# Patient Record
Sex: Female | Born: 1950 | Hispanic: No | Marital: Married | Smoking: Never smoker
Health system: Southern US, Community
[De-identification: ages and names within clinical notes are randomized; demographics above are authoritative.]

## PROBLEM LIST (undated history)

## (undated) ENCOUNTER — Ambulatory Visit (HOSPITAL_COMMUNITY): Admission: EM | Discharge status: Absent without leave | Payer: Self-pay

## (undated) DIAGNOSIS — M8580 Other specified disorders of bone density and structure, unspecified site: Secondary | ICD-10-CM

## (undated) HISTORY — PX: INCONTINENCE SURGERY: SHX676

---

## 2001-12-24 ENCOUNTER — Other Ambulatory Visit: Admission: RE | Admit: 2001-12-24 | Discharge: 2001-12-24 | Payer: Self-pay | Admitting: Family Medicine

## 2002-01-07 ENCOUNTER — Encounter: Admission: RE | Admit: 2002-01-07 | Discharge: 2002-01-07 | Payer: Self-pay | Admitting: Family Medicine

## 2002-01-07 ENCOUNTER — Encounter: Payer: Self-pay | Admitting: Family Medicine

## 2004-09-26 ENCOUNTER — Ambulatory Visit: Payer: Self-pay | Admitting: Family Medicine

## 2004-10-30 ENCOUNTER — Other Ambulatory Visit: Admission: RE | Admit: 2004-10-30 | Discharge: 2004-10-30 | Payer: Self-pay | Admitting: Family Medicine

## 2004-10-30 ENCOUNTER — Ambulatory Visit: Payer: Self-pay | Admitting: Family Medicine

## 2005-12-25 ENCOUNTER — Encounter: Payer: Self-pay | Admitting: Family Medicine

## 2005-12-25 ENCOUNTER — Other Ambulatory Visit: Admission: RE | Admit: 2005-12-25 | Discharge: 2005-12-25 | Payer: Self-pay | Admitting: Family Medicine

## 2005-12-25 ENCOUNTER — Ambulatory Visit: Payer: Self-pay | Admitting: Family Medicine

## 2006-04-22 ENCOUNTER — Ambulatory Visit: Payer: Self-pay | Admitting: Family Medicine

## 2007-05-20 ENCOUNTER — Ambulatory Visit: Payer: Self-pay | Admitting: Family Medicine

## 2010-07-21 ENCOUNTER — Encounter: Payer: Self-pay | Admitting: Family Medicine

## 2013-03-04 ENCOUNTER — Emergency Department (INDEPENDENT_AMBULATORY_CARE_PROVIDER_SITE_OTHER)
Admission: EM | Admit: 2013-03-04 | Discharge: 2013-03-04 | Disposition: A | Discharge status: Home / Self Care | Payer: 59 | Source: Home / Self Care | Attending: Family Medicine | Admitting: Family Medicine

## 2013-03-04 ENCOUNTER — Encounter: Payer: Self-pay | Admitting: *Deleted

## 2013-03-04 DIAGNOSIS — M533 Sacrococcygeal disorders, not elsewhere classified: Secondary | ICD-10-CM

## 2013-03-04 HISTORY — DX: Other specified disorders of bone density and structure, unspecified site: M85.80

## 2013-03-04 MED ORDER — MELOXICAM 15 MG PO TABS: 15.0000 mg | ORAL_TABLET | Freq: Every day | ORAL | Status: DC

## 2013-03-04 NOTE — ED Notes (Signed)
Pt c/o lower back pain started on 8/28, resolved then started again last night. Pain does not radiate. Hx of osteopenia on right lower back. Taken Advil and IBF OTC.

## 2013-03-04 NOTE — ED Provider Notes (Signed)
CSN: [id number]     Arrival date & time 03/04/13  1311 History   First MD Initiated Contact with Patient 03/04/13 1341     Chief Complaint  Patient presents with  . Back Pain      HPI Comments: While getting out of shower 9 days ago, patient developed right low back pain which gradually improved.  Yesterday after lifting a heavy box at work, she developed stiffness in her lower back.  Today she has pain in her right lower back that does not radiate.  No bowel or bladder dysfunction.  No saddle numbness.  Patient is a 62 y.o. female presenting with back pain. The history is provided by the patient.  Back Pain Location:  Sacro-iliac joint Quality:  Aching Radiates to:  Does not radiate Pain severity:  Mild Pain is:  Worse during the day Onset quality:  Gradual Duration:  1 day Timing:  Constant Progression:  Unchanged Chronicity:  Recurrent Context: lifting heavy objects   Relieved by:  Nothing Worsened by:  Bending and twisting Ineffective treatments:  Ibuprofen Associated symptoms: no abdominal pain, no bladder incontinence, no bowel incontinence, no dysuria, no fever, no leg pain, no numbness, no paresthesias, no pelvic pain, no perianal numbness, no tingling, no weakness and no weight loss   Risk factors comment:  History of osteopenia   Past Medical History  Diagnosis Date  . Osteopenia    Past Surgical History  Procedure Laterality Date  . Incontinence surgery     History reviewed. No pertinent family history. History  Substance Use Topics  . Smoking status: Never Smoker   . Smokeless tobacco: Never Used  . Alcohol Use: No   OB History   Grav Para Term Preterm Abortions TAB SAB Ect Mult Living                 Review of Systems  Constitutional: Negative for fever and weight loss.  Gastrointestinal: Negative for abdominal pain and bowel incontinence.  Genitourinary: Negative for bladder incontinence, dysuria and pelvic pain.  Musculoskeletal: Positive for back  pain.  Neurological: Negative for tingling, weakness, numbness and paresthesias.  All other systems reviewed and are negative.    Allergies  Review of patient's allergies indicates no known allergies.  Home Medications   Current Outpatient Rx  Name  Route  Sig  Dispense  Refill  . meloxicam (MOBIC) 15 MG tablet   Oral   Take 1 tablet (15 mg total) by mouth daily. Take with food each morning   15 tablet   0    BP 135/82  Pulse 68  Resp 16  Ht 5\' 4"  (1.626 m)  Wt 144 lb (65.318 kg)  BMI 24.71 kg/m2  SpO2 100% Physical Exam Nursing notes and Vital Signs reviewed. Appearance:  Patient appears healthy, stated age, and in no acute distress Eyes:  Pupils are equal, round, and reactive to light and accomodation.  Extraocular movement is intact.  Conjunctivae are not inflamed  Pharynx:  Normal Neck:  Supple.   No adenopathy Lungs:  Clear to auscultation.  Breath sounds are equal.  Heart:  Regular rate and rhythm without murmurs, rubs, or gallops.  Abdomen:  Nontender without masses or hepatosplenomegaly.  Bowel sounds are present.  No CVA or   Flank tenderness Extremities:  No edema.  No calf tenderness Skin:  No rash present.   Back:  Decreased range of motion.  Can heel/toe walk and squat without difficulty.  Back is nontender to palpation.  Straight  leg raising test is negative.  Sitting knee extension test is negative.  Strength and sensation in the lower extremities is normal.  Patellar and achilles reflexes are normal  ED Course  Procedures  none        MDM   1. Sacroiliac joint dysfunction of both sides    Begin Mobic. Apply ice pack 3 to 4 times daily.  Use lumbar pad while driving.  Begin stretching exercises. Followup with [facility] if not improving about two weeks.     Lattie Haw, MD 03/07/13 2010

## 2013-03-09 ENCOUNTER — Encounter: Payer: Self-pay | Admitting: Sports Medicine

## 2013-03-09 ENCOUNTER — Ambulatory Visit (INDEPENDENT_AMBULATORY_CARE_PROVIDER_SITE_OTHER): Payer: 59 | Admitting: Sports Medicine

## 2013-03-09 ENCOUNTER — Ambulatory Visit: Payer: 59 | Admitting: Physical Therapy

## 2013-03-09 VITALS — BP 133/77 | HR 63 | Wt 145.0 lb

## 2013-03-09 DIAGNOSIS — M545 Low back pain, unspecified: Secondary | ICD-10-CM

## 2013-03-09 DIAGNOSIS — M5416 Radiculopathy, lumbar region: Secondary | ICD-10-CM | POA: Insufficient documentation

## 2013-03-09 DIAGNOSIS — M6281 Muscle weakness (generalized): Secondary | ICD-10-CM

## 2013-03-09 MED ORDER — PREDNISONE 50 MG PO TABS: ORAL_TABLET | ORAL | Status: DC

## 2013-03-09 NOTE — Progress Notes (Signed)
   Subjective:    I'm seeing this patient as a consultation for:  Dr. Cathren Harsh  CC: Low back pain  HPI: This is a very pleasant 62 year old female, unfortunately several weeks ago she flexed her right leg and leaned forward in the shower to clean her leg. She felt a pop in her back and subsequent pain that radiated down the posterolateral aspect of her right leg and buttock, but not past the knee. Since then the pain has been worse when sitting in a car, flexion, and with Valsalva. No bowel or bladder dysfunction, symptoms are moderate, persistent. She was given some Mobic, and this helped significantly. Unfortunately she still has pain and difficulty with moving. No bowel or bladder dysfunction, no saddle numbness.  Past medical history, Surgical history, Family history not pertinant except as noted below, Social history, Allergies, and medications have been entered into the medical record, reviewed, and no changes needed.   Review of Systems: No headache, visual changes, nausea, vomiting, diarrhea, constipation, dizziness, abdominal pain, skin rash, fevers, chills, night sweats, weight loss, swollen lymph nodes, body aches, joint swelling, muscle aches, chest pain, shortness of breath, mood changes, visual or auditory hallucinations.   Objective:   General: Well Developed, well nourished, and in no acute distress.  Neuro/Psych: Alert and oriented x3, extra-ocular muscles intact, able to move all 4 extremities, sensation grossly intact. Skin: Warm and dry, no rashes noted.  Respiratory: Not using accessory muscles, speaking in full sentences, trachea midline.  Cardiovascular: Pulses palpable, no extremity edema. Abdomen: Does not appear distended. Back Exam:  Inspection: Unremarkable  Motion: Flexion 45 deg, Extension 45 deg, Side Bending to 45 deg bilaterally,  Rotation to 45 deg bilaterally  SLR laying: Negative  XSLR laying: Negative  Palpable tenderness: None. FABER: negative. Sensory  change: Gross sensation intact to all lumbar and sacral dermatomes.  Reflexes: 2+ at both patellar tendons, 2+ at achilles tendons, Babinski's downgoing.  Strength at foot  Plantar-flexion: 5/5 Dorsi-flexion: 5/5 Eversion: 5/5 Inversion: 5/5  Leg strength  Quad: 5/5 Hamstring: 5/5 Hip flexor: 5/5 Hip abductors: 5/5  Gait unremarkable. Impression and Recommendations:   This case required medical decision making of moderate complexity.

## 2013-03-09 NOTE — Assessment & Plan Note (Signed)
Axial predominantly discogenic. Prednisone for 5 days, out of work until Monday, continue Mobic. Return to see me in 4 weeks, then we can consider an MRI for interventional injection planning if no better after that.

## 2013-03-10 ENCOUNTER — Encounter: Payer: 59 | Admitting: Physical Therapy

## 2013-03-10 DIAGNOSIS — M545 Low back pain: Secondary | ICD-10-CM

## 2013-03-10 DIAGNOSIS — M6281 Muscle weakness (generalized): Secondary | ICD-10-CM

## 2013-03-14 ENCOUNTER — Encounter: Payer: 59 | Admitting: Physical Therapy

## 2013-03-14 DIAGNOSIS — M545 Low back pain: Secondary | ICD-10-CM

## 2013-03-14 DIAGNOSIS — M6281 Muscle weakness (generalized): Secondary | ICD-10-CM

## 2013-03-15 ENCOUNTER — Encounter: Payer: 59 | Admitting: Physical Therapy

## 2013-03-21 ENCOUNTER — Encounter: Payer: 59 | Admitting: Physical Therapy

## 2013-03-21 DIAGNOSIS — M6281 Muscle weakness (generalized): Secondary | ICD-10-CM

## 2013-03-21 DIAGNOSIS — M545 Low back pain: Secondary | ICD-10-CM

## 2013-03-24 ENCOUNTER — Encounter: Payer: 59 | Admitting: Physical Therapy

## 2013-03-24 DIAGNOSIS — M545 Low back pain: Secondary | ICD-10-CM

## 2013-03-24 DIAGNOSIS — M6281 Muscle weakness (generalized): Secondary | ICD-10-CM

## 2013-04-07 ENCOUNTER — Ambulatory Visit: Payer: 59 | Admitting: Sports Medicine

## 2014-02-28 ENCOUNTER — Ambulatory Visit: Payer: Self-pay

## 2014-05-23 DIAGNOSIS — M722 Plantar fascial fibromatosis: Secondary | ICD-10-CM | POA: Insufficient documentation

## 2014-05-23 DIAGNOSIS — E559 Vitamin D deficiency, unspecified: Secondary | ICD-10-CM | POA: Insufficient documentation

## 2014-05-23 DIAGNOSIS — L720 Epidermal cyst: Secondary | ICD-10-CM | POA: Insufficient documentation

## 2014-05-23 DIAGNOSIS — M810 Age-related osteoporosis without current pathological fracture: Secondary | ICD-10-CM | POA: Insufficient documentation

## 2014-05-23 DIAGNOSIS — J302 Other seasonal allergic rhinitis: Secondary | ICD-10-CM | POA: Insufficient documentation

## 2014-05-23 DIAGNOSIS — G4700 Insomnia, unspecified: Secondary | ICD-10-CM | POA: Insufficient documentation

## 2014-05-23 DIAGNOSIS — Z780 Asymptomatic menopausal state: Secondary | ICD-10-CM | POA: Insufficient documentation

## 2014-05-23 DIAGNOSIS — M7661 Achilles tendinitis, right leg: Secondary | ICD-10-CM | POA: Insufficient documentation

## 2014-06-06 ENCOUNTER — Ambulatory Visit (INDEPENDENT_AMBULATORY_CARE_PROVIDER_SITE_OTHER): Payer: 59

## 2014-06-06 VITALS — BP 105/62 | HR 62 | Resp 12

## 2014-06-06 DIAGNOSIS — M79671 Pain in right foot: Secondary | ICD-10-CM

## 2014-06-06 DIAGNOSIS — M7731 Calcaneal spur, right foot: Secondary | ICD-10-CM

## 2014-06-06 DIAGNOSIS — M7661 Achilles tendinitis, right leg: Secondary | ICD-10-CM

## 2014-06-06 DIAGNOSIS — M722 Plantar fascial fibromatosis: Secondary | ICD-10-CM

## 2014-06-06 MED ORDER — DICLOFENAC SODIUM 75 MG PO TBEC: 75.0000 mg | DELAYED_RELEASE_TABLET | Freq: Two times a day (BID) | ORAL | Status: DC

## 2014-06-06 NOTE — Patient Instructions (Signed)

## 2014-06-06 NOTE — Progress Notes (Signed)
   Subjective:    Patient ID: Brooke CharsKamaljit Grabinski, female    DOB: 03/19/1951, 63 y.o.   MRN: [id number]  HPI PT STATED BACK AND BOTTOM OF THE HEEL IS BEEN PAINFUL FOR 3 WEEKS. THE HEEL IS BEEN THE SAME BUT GET WORSE WHEN DRIVING OR FLEXING. TRIED STRETCHING AND MASSAGE OIL AND IT HELP SOME.   Review of Systems  All other systems reviewed and are negative.      Objective:   Physical Exam Lower extremity objective findings as follows this is a 63 year old female well-developed well-nourished oriented 3 presents at this time with a complaint of pain probably 2 or 3 year history of pain she's had plantar fascial pain in the past she's had retrocalcaneal pain currently the posterior inferior right heel are painful has constant pain in the back of her right heel kilos tendon insertion which is reproducible at this time. Also has sharp pain in the inferior heel pain on first up in the morning are first up after a period of rest also aggravated after sitting or driving and it in her truck for a period of about 30 minutes or longer. No history of injury or trauma otherwise noted patient's worn inserts in her shoes wearing good stable shoes at this time. Objective findings as follows vascular status is intact with pedal pulses palpable DP and PT +2 over 4 bilateral Refill time 3 seconds all digits epicritic and proprioceptive sensations intact and symmetric bilateral there is normal plantar response DTRs not listed dermatologically skin color pigment normal hair growth absent nails somewhat criptotic. Orthopedic exam there is pain on palpation of the retrocalcaneal insertion site of the Achilles tendon also some inferior medial plantar fascial band insertion . Right foot more painful than left at this time. X-rays brought by the patient indicated well-developed inferior retrocalcaneal spurring posterior middle facet subtalar joint intact no fractures no cyst or tumors clinically there is presence of posterior  calcaneal area that the Achilles tendon insertion with some soft tissue swelling consistent with Achilles tendinitis [place] compensatory gait changes due to plantar fascial symptomology as well.       Assessment & Plan:  Assessment Achilles tendinitis secondary plantar fascial symptoms on June strain and inferior calcaneal spurring patient declines steroid injection or prednisone at this time will use an NSAID in the passive been on low boot was tried diclofenac 75 mg twice a day with 1 refill month 1 month supply with 1 refill is issued. Patient will also use warm compresses ice pack or ice to the posterior heel area as well as posterior inferior heel area fascial strapping applied to the right foot reassess within 2 weeks for follow-up may be candidate for orthoses or other noninvasive or invasive treatment based on progress or failure to improve and possibly combination of plantar fasciitis as well as Achilles tendinitis to compensatory gait changes patient also has a history of some back issues treated earlier this year the pain is sharp pain inferior heel area may be associated with a radiculopathy will need to address that if it fails to improve next  Alvan Dameichard Theodore Rahrig DPM

## 2014-06-27 ENCOUNTER — Ambulatory Visit: Payer: 59

## 2014-09-06 ENCOUNTER — Ambulatory Visit: Payer: 59 | Admitting: Internal Medicine

## 2014-10-26 DIAGNOSIS — R5383 Other fatigue: Secondary | ICD-10-CM | POA: Insufficient documentation

## 2015-04-12 DIAGNOSIS — M1711 Unilateral primary osteoarthritis, right knee: Secondary | ICD-10-CM | POA: Insufficient documentation

## 2015-07-25 DIAGNOSIS — B360 Pityriasis versicolor: Secondary | ICD-10-CM | POA: Insufficient documentation

## 2016-05-09 ENCOUNTER — Telehealth: Payer: Self-pay | Admitting: Rheumatology

## 2016-05-09 NOTE — Telephone Encounter (Signed)
Patient is having pain in shoulder after patient got her flu shot on 04/18/16. Patient states the pain is worse at night. Please advise.

## 2016-05-09 NOTE — Telephone Encounter (Signed)
She should call the provider who administered the vaccine, or see her primary care. She voiced understanding, states since she got the vaccine at the pharmacy she will contact her PCP

## 2016-05-27 ENCOUNTER — Ambulatory Visit (INDEPENDENT_AMBULATORY_CARE_PROVIDER_SITE_OTHER): Payer: 59

## 2016-05-27 ENCOUNTER — Encounter: Payer: Self-pay | Admitting: Sports Medicine

## 2016-05-27 ENCOUNTER — Ambulatory Visit (INDEPENDENT_AMBULATORY_CARE_PROVIDER_SITE_OTHER): Payer: 59 | Admitting: Sports Medicine

## 2016-05-27 DIAGNOSIS — M7542 Impingement syndrome of left shoulder: Secondary | ICD-10-CM

## 2016-05-27 DIAGNOSIS — M25512 Pain in left shoulder: Secondary | ICD-10-CM

## 2016-05-27 NOTE — Assessment & Plan Note (Signed)
Symptoms present for 5 weeks now and waking her from sleep. Subacromial injection as above, rehabilitation exercises given, x-rays, return to see me in one month.

## 2016-05-27 NOTE — Progress Notes (Signed)
   Subjective:    I'm seeing this patient as a consultation for:  Dr. Riley NearingAguiar  CC: Left shoulder pain  HPI: Over one month ago this pleasant 65 year old female had a flu shot, she feels as though it was placed to high, subsequently she's had pain that she localizes over the deltoid, worse with overhead activities, moderate, persistent without radiation, it does wake her at night. No constitutional symptoms, no mechanical symptoms  Past medical history:  Negative.  See flowsheet/record as well for more information.  Surgical history: Negative.  See flowsheet/record as well for more information.  Family history: Negative.  See flowsheet/record as well for more information.  Social history: Negative.  See flowsheet/record as well for more information.  Allergies, and medications have been entered into the medical record, reviewed, and no changes needed.   Review of Systems: No headache, visual changes, nausea, vomiting, diarrhea, constipation, dizziness, abdominal pain, skin rash, fevers, chills, night sweats, weight loss, swollen lymph nodes, body aches, joint swelling, muscle aches, chest pain, shortness of breath, mood changes, visual or auditory hallucinations.   Objective:   General: Well Developed, well nourished, and in no acute distress.  Neuro/Psych: Alert and oriented x3, extra-ocular muscles intact, able to move all 4 extremities, sensation grossly intact. Skin: Warm and dry, no rashes noted.  Respiratory: Not using accessory muscles, speaking in full sentences, trachea midline.  Cardiovascular: Pulses palpable, no extremity edema. Abdomen: Does not appear distended. Left Shoulder: Inspection reveals no abnormalities, atrophy or asymmetry. Palpation is normal with no tenderness over AC joint or bicipital groove. ROM is full in all planes. Rotator cuff strength normal throughout. Positive Neer and Hawkin's tests, empty can. Speeds and Yergason's tests normal. No labral pathology  noted with negative Obrien's, negative crank, negative clunk, and good stability. Normal scapular function observed. No painful arc and no drop arm sign. No apprehension sign  Procedure: Real-time Ultrasound Guided Injection of left subacromial bursa Device: GE Logiq E  Verbal informed consent obtained.  Time-out conducted.  Noted no overlying erythema, induration, or other signs of local infection.  Skin prepped in a sterile fashion.  Local anesthesia: Topical Ethyl chloride.  With sterile technique and under real time ultrasound guidance:  1 mL kenalog 40, 1 mL lidocaine, 1 mL Marcaine injected easily. Completed without difficulty  Pain immediately resolved suggesting accurate placement of the medication.  Advised to call if fevers/chills, erythema, induration, drainage, or persistent bleeding.  Images permanently stored and available for review in the ultrasound unit.  Impression: Technically successful ultrasound guided injection.  Impression and Recommendations:   This case required medical decision making of moderate complexity.  Impingement syndrome of shoulder, left Symptoms present for 5 weeks now and waking her from sleep. Subacromial injection as above, rehabilitation exercises given, x-rays, return to see me in one month.

## 2016-06-26 ENCOUNTER — Ambulatory Visit (INDEPENDENT_AMBULATORY_CARE_PROVIDER_SITE_OTHER): Payer: 59 | Admitting: Sports Medicine

## 2016-06-26 DIAGNOSIS — M170 Bilateral primary osteoarthritis of knee: Secondary | ICD-10-CM | POA: Insufficient documentation

## 2016-06-26 DIAGNOSIS — M7542 Impingement syndrome of left shoulder: Secondary | ICD-10-CM

## 2016-06-26 DIAGNOSIS — M1711 Unilateral primary osteoarthritis, right knee: Secondary | ICD-10-CM

## 2016-06-26 MED ORDER — DICLOFENAC SODIUM 2 % TD SOLN: 2.0000 | Freq: Two times a day (BID) | TRANSDERMAL | 11 refills | Status: DC

## 2016-06-26 NOTE — Assessment & Plan Note (Signed)
Resolved after subacromial injection.

## 2016-06-26 NOTE — Assessment & Plan Note (Addendum)
Patient has been seeing chiropractor who added a lift to her left orthotic, that he made. Unfortunately the left leg is actually longer than the right, I would like her to come back and have me build her some custom orthotics with a lift on the right. Adding topical diclofenac, formal physical therapy. She already has an MRI, the results of which are in the overview, she has also just finished a course of Synvisc 4 months ago. Return to see me for custom orthotics.

## 2016-06-26 NOTE — Progress Notes (Signed)
   Subjective:    I'm seeing this patient as a consultation for:  Dr. Bernadette Hoitafaela Aguiar  CC: Right knee pain  HPI: Shoulder pain: Resolved after subacromial injection at the last visit.  Knee pain: Present for a long time, known osteoarthritis, she also has an MRI that shows osteoarthritis with degenerative meniscal findings, she finished a Synvisc series in August of this year. Having persistent pain, would like to avoid oral NSAIDs for now. Her main goal here is to gain her range of motion and strength. She does have a set of custom orthotics made by her chiropractor, who told her that her right leg was longer and placed a lift on the left.  Past medical history:  Negative.  See flowsheet/record as well for more information.  Surgical history: Negative.  See flowsheet/record as well for more information.  Family history: Negative.  See flowsheet/record as well for more information.  Social history: Negative.  See flowsheet/record as well for more information.  Allergies, and medications have been entered into the medical record, reviewed, and no changes needed.   Review of Systems: No headache, visual changes, nausea, vomiting, diarrhea, constipation, dizziness, abdominal pain, skin rash, fevers, chills, night sweats, weight loss, swollen lymph nodes, body aches, joint swelling, muscle aches, chest pain, shortness of breath, mood changes, visual or auditory hallucinations.   Objective:   General: Well Developed, well nourished, and in no acute distress.  Neuro/Psych: Alert and oriented x3, extra-ocular muscles intact, able to move all 4 extremities, sensation grossly intact. Skin: Warm and dry, no rashes noted.  Respiratory: Not using accessory muscles, speaking in full sentences, trachea midline.  Cardiovascular: Pulses palpable, no extremity edema. Abdomen: Does not appear distended. Right Knee: Normal to inspection with no erythema or effusion or obvious bony abnormalities. Tenderness  at the medial joint line. ROM normal in flexion and extension and lower leg rotation. Ligaments with solid consistent endpoints including ACL, PCL, LCL, MCL. Negative Mcmurray's and provocative meniscal tests. Non painful patellar compression. Patellar and quadriceps tendons unremarkable. Hamstring and quadriceps strength is normal.  Left leg is longer than the right by 2 cm, this was measured with measuring tape.  Impression and Recommendations:   This case required medical decision making of moderate complexity.  Impingement syndrome of shoulder, left Resolved after subacromial injection.  Primary osteoarthritis of right knee Patient has been seeing chiropractor who added a lift to her left orthotic, that he made. Unfortunately the left leg is actually longer than the right, I would like her to come back and have me build her some custom orthotics with a lift on the right. Adding topical diclofenac, formal physical therapy. She already has an MRI, the results of which are in the overview, she has also just finished a course of Synvisc 4 months ago. Return to see me for custom orthotics.

## 2016-07-02 ENCOUNTER — Ambulatory Visit (INDEPENDENT_AMBULATORY_CARE_PROVIDER_SITE_OTHER): Payer: 59 | Admitting: Sports Medicine

## 2016-07-02 DIAGNOSIS — M1711 Unilateral primary osteoarthritis, right knee: Secondary | ICD-10-CM

## 2016-07-02 NOTE — Assessment & Plan Note (Signed)
Custom orthotics as above with lift on the right. Continue Pennsaid, rehabilitation exercises given. Return to see me in one month.

## 2016-07-02 NOTE — Progress Notes (Addendum)
    Patient was fitted for a : standard, cushioned, semi-rigid orthotic. The orthotic was heated and afterward the patient stood on the orthotic blank positioned on the orthotic stand. The patient was positioned in subtalar neutral position and 10 degrees of ankle dorsiflexion in a weight bearing stance. After completion of molding, a stable base was applied to the orthotic blank. The blank was ground to a stable position for weight bearing. Size: 7 Base: [facility] Additional Posting and Padding: Extra EVA on the right The patient ambulated these, and they were very comfortable.  I spent 40 minutes with this patient, greater than 50% was face-to-face time counseling regarding the below diagnosis.

## 2016-07-29 ENCOUNTER — Encounter: Payer: 59 | Admitting: Sports Medicine

## 2016-08-05 ENCOUNTER — Encounter: Payer: 59 | Admitting: Sports Medicine

## 2016-08-14 ENCOUNTER — Encounter: Payer: 59 | Admitting: Sports Medicine

## 2017-03-11 ENCOUNTER — Ambulatory Visit (INDEPENDENT_AMBULATORY_CARE_PROVIDER_SITE_OTHER): Payer: 59 | Admitting: Sports Medicine

## 2017-03-11 DIAGNOSIS — M7542 Impingement syndrome of left shoulder: Secondary | ICD-10-CM

## 2017-03-11 MED ORDER — IBUPROFEN 800 MG PO TABS: 800.0000 mg | ORAL_TABLET | Freq: Three times a day (TID) | ORAL | 2 refills | Status: DC | PRN

## 2017-03-11 NOTE — Progress Notes (Signed)
  Subjective:    CC: Recurrence of left shoulder pain  HPI: This is a pleasant 66 year old female, she had left rotator cuff dysfunction at the last visit, I injected her about 10 months ago she did extremely well. Now having a recurrence of pain over her left deltoid, worse with lifting and overhead activities, moderate, persistent without radiation.  Past medical history:  Negative.  See flowsheet/record as well for more information.  Surgical history: Negative.  See flowsheet/record as well for more information.  Family history: Negative.  See flowsheet/record as well for more information.  Social history: Negative.  See flowsheet/record as well for more information.  Allergies, and medications have been entered into the medical record, reviewed, and no changes needed.   Review of Systems: No fevers, chills, night sweats, weight loss, chest pain, or shortness of breath.   Objective:    General: Well Developed, well nourished, and in no acute distress.  Neuro: Alert and oriented x3, extra-ocular muscles intact, sensation grossly intact.  HEENT: Normocephalic, atraumatic, pupils equal round reactive to light, neck supple, no masses, no lymphadenopathy, thyroid nonpalpable.  Skin: Warm and dry, no rashes. Cardiac: Regular rate and rhythm, no murmurs rubs or gallops, no lower extremity edema.  Respiratory: Clear to auscultation bilaterally. Not using accessory muscles, speaking in full sentences. Left Shoulder: Inspection reveals no abnormalities, atrophy or asymmetry. Palpation is normal with no tenderness over AC joint or bicipital groove. ROM is full in all planes. Rotator cuff strength normal throughout. Positive Neer and Hawkin's tests, empty can. Speeds and Yergason's tests normal. No labral pathology noted with negative Obrien's, negative crank, negative clunk, and good stability. Normal scapular function observed. Positive painful arc and no drop arm sign. No apprehension  sign  Impression and Recommendations:    Impingement syndrome of shoulder, left Recurrence of left shoulder impingement symptoms. Responded extremely well to a subacromial injection 10 months ago. No injection today, I'm going to get her on ibuprofen 800 as well as rehabilitation exercises with theraband. Return in one month, injection if no better.  I spent 25 minutes with this patient, greater than 50% was face-to-face time counseling regarding the above diagnoses, I spent an extensive amount of time showing her how to do the rehabilitation exercises. ___________________________________________ Ihor Austinhomas J. Benjamin Stainhekkekandam, M.D., ABFM., CAQSM. Primary Care and Sports Medicine [facility]  Adjunct Instructor of Family Medicine  [employer]

## 2017-03-11 NOTE — Assessment & Plan Note (Signed)
Recurrence of left shoulder impingement symptoms. Responded extremely well to a subacromial injection 10 months ago. No injection today, I'm going to get her on ibuprofen 800 as well as rehabilitation exercises with theraband. Return in one month, injection if no better.

## 2017-03-24 ENCOUNTER — Encounter: Payer: Self-pay | Admitting: Sports Medicine

## 2017-03-24 ENCOUNTER — Ambulatory Visit (INDEPENDENT_AMBULATORY_CARE_PROVIDER_SITE_OTHER): Payer: 59 | Admitting: Sports Medicine

## 2017-03-24 DIAGNOSIS — M7542 Impingement syndrome of left shoulder: Secondary | ICD-10-CM | POA: Diagnosis not present

## 2017-03-24 NOTE — Assessment & Plan Note (Signed)
Patient returned early, she is improving considerably, she did respond well to 11 months ago to a subacromial injection. Continuing to improve with ibuprofen 800 mg, rehabilitation exercises. No further intervention today and she can return as needed.

## 2017-03-24 NOTE — Progress Notes (Signed)
  Subjective:    CC: follow-up  HPI: This is a pleasant 66 year old female, we added rotator cuff exercises the last visit, she returns a bit early, with pain on most completely resolved. She does desire that we write her a specific work restrictions noted.  Past medical history:  Negative.  See flowsheet/record as well for more information.  Surgical history: Negative.  See flowsheet/record as well for more information.  Family history: Negative.  See flowsheet/record as well for more information.  Social history: Negative.  See flowsheet/record as well for more information.  Allergies, and medications have been entered into the medical record, reviewed, and no changes needed.   Review of Systems: No fevers, chills, night sweats, weight loss, chest pain, or shortness of breath.   Objective:    General: Well Developed, well nourished, and in no acute distress.  Neuro: Alert and oriented x3, extra-ocular muscles intact, sensation grossly intact.  HEENT: Normocephalic, atraumatic, pupils equal round reactive to light, neck supple, no masses, no lymphadenopathy, thyroid nonpalpable.  Skin: Warm and dry, no rashes. Cardiac: Regular rate and rhythm, no murmurs rubs or gallops, no lower extremity edema.  Respiratory: Clear to auscultation bilaterally. Not using accessory muscles, speaking in full sentences. Left Shoulder: Inspection reveals no abnormalities, atrophy or asymmetry. Palpation is normal with no tenderness over AC joint or bicipital groove. ROM is full in all planes. Rotator cuff strength normal throughout. No signs of impingement with negative Neer and Hawkin's tests, empty can. Speeds and Yergason's tests normal. No labral pathology noted with negative Obrien's, negative crank, negative clunk, and good stability. Normal scapular function observed. No painful arc and no drop arm sign. No apprehension sign  Impression and Recommendations:    Impingement syndrome of  shoulder, left Patient returned early, she is improving considerably, she did respond well to 11 months ago to a subacromial injection. Continuing to improve with ibuprofen 800 mg, rehabilitation exercises. No further intervention today and she can return as needed.  ___________________________________________ Ihor Austin. Benjamin Stain, M.D., ABFM., CAQSM. Primary Care and Sports Medicine [facility]  Adjunct Instructor of Family Medicine  [employer]

## 2017-04-08 ENCOUNTER — Ambulatory Visit: Payer: 59 | Admitting: Sports Medicine

## 2017-06-05 ENCOUNTER — Ambulatory Visit (INDEPENDENT_AMBULATORY_CARE_PROVIDER_SITE_OTHER): Payer: 59 | Admitting: Sports Medicine

## 2017-06-05 ENCOUNTER — Encounter: Payer: Self-pay | Admitting: Sports Medicine

## 2017-06-05 DIAGNOSIS — M170 Bilateral primary osteoarthritis of knee: Secondary | ICD-10-CM

## 2017-06-05 NOTE — Assessment & Plan Note (Signed)
Right knee is doing well, left knee is having significant pain, not better with physical therapy, ibuprofen. Patient tells me that her insurance covers Visco supplementation without approval. She does have x-rays from an outside clinic. Reaction knee brace. Steroid injection and Monovisc injection today, return in 1 month.

## 2017-06-05 NOTE — Progress Notes (Signed)
  Subjective:    CC: Left knee pain  HPI: This is a pleasant 66 year old female, she works for [employer], for years she has had right knee pain, this seemed to resolve with custom orthotics with a heel lift.  Unfortunately she has developed pain in her left knee, medial joint line, under the kneecap with gelling, no mechanical symptoms, moderate, persistent without radiation, taking ibuprofen with only minimal relief.  She has done the knee rehab exercises also with only minimal relief.  She has gotten Visco [facility] the [facility], tells me that her insurance company covers Visco supplementation without prior approval.  Past medical history:  Negative.  See flowsheet/record as well for more information.  Surgical history: Negative.  See flowsheet/record as well for more information.  Family history: Negative.  See flowsheet/record as well for more information.  Social history: Negative.  See flowsheet/record as well for more information.  Allergies, and medications have been entered into the medical record, reviewed, and no changes needed.   (To billers/coders, pertinent past medical, social, surgical, family history can be found in problem list, if problem list is marked as reviewed then this indicates that past medical, social, surgical, family history was also reviewed)  Review of Systems: No fevers, chills, night sweats, weight loss, chest pain, or shortness of breath.   Objective:    General: Well Developed, well nourished, and in no acute distress.  Neuro: Alert and oriented x3, extra-ocular muscles intact, sensation grossly intact.  HEENT: Normocephalic, atraumatic, pupils equal round reactive to light, neck supple, no masses, no lymphadenopathy, thyroid nonpalpable.  Skin: Warm and dry, no rashes. Cardiac: Regular rate and rhythm, no murmurs rubs or gallops, no lower extremity edema.  Respiratory: Clear to auscultation bilaterally. Not using accessory  muscles, speaking in full sentences. Left knee: Only minimal effusion with tenderness at the medial joint line ROM normal in flexion and extension and lower leg rotation. Ligaments with solid consistent endpoints including ACL, PCL, LCL, MCL. Negative Mcmurray's and provocative meniscal tests. Non painful patellar compression. Patellar and quadriceps tendons unremarkable. Hamstring and quadriceps strength is normal.  Procedure: Real-time Ultrasound Guided Injection of left knee Device: GE Logiq E  Verbal informed consent obtained.  Time-out conducted.  Noted no overlying erythema, induration, or other signs of local infection.  Skin prepped in a sterile fashion.  Local anesthesia: Topical Ethyl chloride.  With sterile technique and under real time ultrasound guidance: Using a 22-gauge needle advanced into the suprapatellar recess, there was a mild effusion, I then injected 1 cc kenalog 40, 2 cc lidocaine, 2 cc bupivacaine, syringe switched and Monovisc injected in its entirety into the knee as well. Completed without difficulty  Pain immediately resolved suggesting accurate placement of the medication.  Advised to call if fevers/chills, erythema, induration, drainage, or persistent bleeding.  Images permanently stored and available for review in the ultrasound unit.  Impression: Technically successful ultrasound guided injection.  Impression and Recommendations:    Primary osteoarthritis of both knees Right knee is doing well, left knee is having significant pain, not better with physical therapy, ibuprofen. Patient tells me that her insurance covers Visco supplementation without approval. She does have x-rays from an outside clinic. Reaction knee brace. Steroid injection and Monovisc injection today, return in 1 month.  ___________________________________________ Ihor Austinhomas J. Benjamin Stainhekkekandam, M.D., ABFM., CAQSM. Primary Care and Sports Medicine [facility]  [facility]  Adjunct Instructor of Family Medicine  [employer]

## 2017-06-15 ENCOUNTER — Encounter: Payer: Self-pay | Admitting: Sports Medicine

## 2017-06-15 ENCOUNTER — Ambulatory Visit (INDEPENDENT_AMBULATORY_CARE_PROVIDER_SITE_OTHER): Payer: 59 | Admitting: Sports Medicine

## 2017-06-15 DIAGNOSIS — M170 Bilateral primary osteoarthritis of knee: Secondary | ICD-10-CM | POA: Diagnosis not present

## 2017-06-15 DIAGNOSIS — M5416 Radiculopathy, lumbar region: Secondary | ICD-10-CM

## 2017-06-15 MED ORDER — PREDNISONE 50 MG PO TABS: ORAL_TABLET | ORAL | 0 refills | Status: DC

## 2017-06-15 MED ORDER — TRAMADOL HCL 50 MG PO TABS: ORAL_TABLET | ORAL | 0 refills | Status: DC

## 2017-06-15 NOTE — Progress Notes (Signed)
  Subjective:    CC: Back and leg pain  HPI: Knee osteoarthritis: We did a modest injection about a week ago, overall doing well.  Unfortunately she still notes some pain in her knee but she describes in the posterior aspect now, on further questioning it radiates from her back down the back of the left leg to the outside of the left foot. He does have moderate back pain, moderate, persistent. No bowel or bladder trunk, saddle numbness, constitutional symptoms, or trauma.  Past medical history:  Negative.  See flowsheet/record as well for more information.  Surgical history: Negative.  See flowsheet/record as well for more information.  Family history: Negative.  See flowsheet/record as well for more information.  Social history: Negative.  See flowsheet/record as well for more information.  Allergies, and medications have been entered into the medical record, reviewed, and no changes needed.   (To billers/coders, pertinent past medical, social, surgical, family history can be found in problem list, if problem list is marked as reviewed then this indicates that past medical, social, surgical, family history was also reviewed)  Review of Systems: No fevers, chills, night sweats, weight loss, chest pain, or shortness of breath.   Objective:    General: Well Developed, well nourished, and in no acute distress.  Neuro: Alert and oriented x3, extra-ocular muscles intact, sensation grossly intact.  HEENT: Normocephalic, atraumatic, pupils equal round reactive to light, neck supple, no masses, no lymphadenopathy, thyroid nonpalpable.  Skin: Warm and dry, no rashes. Cardiac: Regular rate and rhythm, no murmurs rubs or gallops, no lower extremity edema.  Respiratory: Clear to auscultation bilaterally. Not using accessory muscles, speaking in full sentences. Left Knee: Normal to inspection with no erythema or effusion or obvious bony abnormalities. Palpation normal with no warmth or joint line  tenderness or patellar tenderness or condyle tenderness. ROM normal in flexion and extension and lower leg rotation. Ligaments with solid consistent endpoints including ACL, PCL, LCL, MCL. Negative Mcmurray's and provocative meniscal tests. Non painful patellar compression. Patellar and quadriceps tendons unremarkable. Hamstring and quadriceps strength is normal.  Impression and Recommendations:    Primary osteoarthritis of both knees Seems to be doing okay. We did steroid and Monovisc injection about a week ago.   Left lumbar radiculitis Left L5 radiculitis. 5 days of prednisone, tramadol, she is leaving on her trip today. She can return and we can add physical therapy if needed. ___________________________________________ Ihor Austinhomas J. Benjamin Stainhekkekandam, M.D., ABFM., CAQSM. Primary Care and Sports Medicine [facility]  Adjunct Instructor of Family Medicine  [employer]

## 2017-06-15 NOTE — Assessment & Plan Note (Signed)
Seems to be doing okay. We did steroid and Monovisc injection about a week ago.

## 2017-06-15 NOTE — Assessment & Plan Note (Signed)
Left L5 radiculitis. 5 days of prednisone, tramadol, she is leaving on her trip today. She can return and we can add physical therapy if needed.

## 2017-07-09 ENCOUNTER — Encounter: Payer: Self-pay | Admitting: Sports Medicine

## 2017-07-09 ENCOUNTER — Ambulatory Visit (INDEPENDENT_AMBULATORY_CARE_PROVIDER_SITE_OTHER): Payer: 59 | Admitting: Sports Medicine

## 2017-07-09 DIAGNOSIS — M25562 Pain in left knee: Secondary | ICD-10-CM

## 2017-07-09 DIAGNOSIS — G8929 Other chronic pain: Secondary | ICD-10-CM

## 2017-07-09 DIAGNOSIS — M170 Bilateral primary osteoarthritis of knee: Secondary | ICD-10-CM | POA: Diagnosis not present

## 2017-07-09 NOTE — Assessment & Plan Note (Signed)
Persistent pain in the left knee, minimal swelling, posterior medial joint line. She really does not have any mechanical symptoms. We have done several steroid injections as well as Monovisc, therapy, bracing without much improvement. She does have pain with terminal flexion, all of this is likely representing a degenerative meniscal tear, MRI and referral to Dr. Ramond Marrowax Varkey for arthroscopy. She will continue 800 mg ibuprofen for now.

## 2017-07-09 NOTE — Progress Notes (Signed)
  Subjective:    CC: Recheck knee pain  HPI: This is a pleasant 67 year old female, she works at [employer], we have been treating her left knee pain for some time now, she had steroid injections, Visco supplementation, she has mild osteoarthritis on x-rays, we have done oral ibuprofen 800, therapy, bracing, unfortunately she is continued to have pain, mostly at the medial joint line, moderate, persistent without radiation.  I reviewed the past medical history, family history, social history, surgical history, and allergies today and no changes were needed.  Please see the problem list section below in epic for further details.  Past Medical History: Past Medical History:  Diagnosis Date  . Osteopenia    Past Surgical History: Past Surgical History:  Procedure Laterality Date  . INCONTINENCE SURGERY     Social History: Social History   Socioeconomic History  . Marital status: Married    Spouse name: None  . Number of children: None  . Years of education: None  . Highest education level: None  Social Needs  . Financial resource strain: None  . Food insecurity - worry: None  . Food insecurity - inability: None  . Transportation needs - medical: None  . Transportation needs - non-medical: None  Occupational History  . None  Tobacco Use  . Smoking status: Never Smoker  . Smokeless tobacco: Never Used  Substance and Sexual Activity  . Alcohol use: No  . Drug use: No  . Sexual activity: None  Other Topics Concern  . None  Social History Narrative  . None   Family History: No family history on file. Allergies: No Known Allergies Medications: See med rec.  Review of Systems: No fevers, chills, night sweats, weight loss, chest pain, or shortness of breath.   Objective:    General: Well Developed, well nourished, and in no acute distress.  Neuro: Alert and oriented x3, extra-ocular muscles intact, sensation grossly intact.  HEENT: Normocephalic, atraumatic, pupils equal  round reactive to light, neck supple, no masses, no lymphadenopathy, thyroid nonpalpable.  Skin: Warm and dry, no rashes. Cardiac: Regular rate and rhythm, no murmurs rubs or gallops, no lower extremity edema.  Respiratory: Clear to auscultation bilaterally. Not using accessory muscles, speaking in full sentences. Left Knee: Normal to inspection with no erythema or effusion or obvious bony abnormalities. Tender to palpation at the posterior/medial joint line. ROM normal in flexion and extension and lower leg rotation.  Reproduction of pain with terminal flexion. Ligaments with solid consistent endpoints including ACL, PCL, LCL, MCL. McMurray's produces pain but no pop. Non painful patellar compression. Patellar and quadriceps tendons unremarkable. Hamstring and quadriceps strength is normal.  Impression and Recommendations:    Primary osteoarthritis of both knees Persistent pain in the left knee, minimal swelling, posterior medial joint line. She really does not have any mechanical symptoms. We have done several steroid injections as well as Monovisc, therapy, bracing without much improvement. She does have pain with terminal flexion, all of this is likely representing a degenerative meniscal tear, MRI and referral to Dr. Ramond Marrowax Varkey for arthroscopy. She will continue 800 mg ibuprofen for now.  ___________________________________________ Ihor Austinhomas J. Benjamin Stainhekkekandam, M.D., ABFM., CAQSM. Primary Care and Sports Medicine [facility]  Adjunct Instructor of Family Medicine  [employer]

## 2017-07-13 ENCOUNTER — Ambulatory Visit (INDEPENDENT_AMBULATORY_CARE_PROVIDER_SITE_OTHER): Payer: 59

## 2017-07-13 DIAGNOSIS — M7122 Synovial cyst of popliteal space [Baker], left knee: Secondary | ICD-10-CM

## 2017-07-13 DIAGNOSIS — M23222 Derangement of posterior horn of medial meniscus due to old tear or injury, left knee: Secondary | ICD-10-CM

## 2017-07-13 DIAGNOSIS — M94262 Chondromalacia, left knee: Secondary | ICD-10-CM | POA: Diagnosis not present

## 2017-07-13 DIAGNOSIS — G8929 Other chronic pain: Secondary | ICD-10-CM

## 2017-07-13 DIAGNOSIS — M25562 Pain in left knee: Secondary | ICD-10-CM

## 2017-07-13 DIAGNOSIS — M170 Bilateral primary osteoarthritis of knee: Secondary | ICD-10-CM

## 2017-10-09 ENCOUNTER — Ambulatory Visit (INDEPENDENT_AMBULATORY_CARE_PROVIDER_SITE_OTHER): Payer: 59

## 2017-10-09 ENCOUNTER — Ambulatory Visit (INDEPENDENT_AMBULATORY_CARE_PROVIDER_SITE_OTHER): Payer: 59 | Admitting: Sports Medicine

## 2017-10-09 ENCOUNTER — Encounter: Payer: Self-pay | Admitting: Sports Medicine

## 2017-10-09 DIAGNOSIS — M5416 Radiculopathy, lumbar region: Secondary | ICD-10-CM | POA: Diagnosis not present

## 2017-10-09 DIAGNOSIS — M5136 Other intervertebral disc degeneration, lumbar region: Secondary | ICD-10-CM | POA: Diagnosis not present

## 2017-10-09 DIAGNOSIS — M50322 Other cervical disc degeneration at C5-C6 level: Secondary | ICD-10-CM

## 2017-10-09 DIAGNOSIS — M5030 Other cervical disc degeneration, unspecified cervical region: Secondary | ICD-10-CM | POA: Diagnosis not present

## 2017-10-09 MED ORDER — IBUPROFEN 400 MG PO TABS: 400.0000 mg | ORAL_TABLET | Freq: Three times a day (TID) | ORAL | 3 refills | Status: DC | PRN

## 2017-10-09 NOTE — Progress Notes (Signed)
Subjective:    I'm seeing this patient as a consultation for: Dr. Bernadette Hoit  CC: Neck and back pain  HPI: Low back pain: Left-sided, worse with sitting, flexion, Valsalva, significant stiffness in the morning, no bowel or bladder dysfunction, saddle numbness, constitutional symptoms, does not radiate past the knee.  Neck pain: Axial, occasional radiation into the left periscapular region.  No radiation to the hand or fingertips.  I reviewed the past medical history, family history, social history, surgical history, and allergies today and no changes were needed.  Please see the problem list section below in epic for further details.  Past Medical History: Past Medical History:  Diagnosis Date  . Osteopenia    Past Surgical History: Past Surgical History:  Procedure Laterality Date  . INCONTINENCE SURGERY     Social History: Social History   Socioeconomic History  . Marital status: Married    Spouse name: Not on file  . Number of children: Not on file  . Years of education: Not on file  . Highest education level: Not on file  Occupational History  . Not on file  Social Needs  . Financial resource strain: Not on file  . Food insecurity:    Worry: Not on file    Inability: Not on file  . Transportation needs:    Medical: Not on file    Non-medical: Not on file  Tobacco Use  . Smoking status: Never Smoker  . Smokeless tobacco: Never Used  Substance and Sexual Activity  . Alcohol use: No  . Drug use: No  . Sexual activity: Not on file  Lifestyle  . Physical activity:    Days per week: Not on file    Minutes per session: Not on file  . Stress: Not on file  Relationships  . Social connections:    Talks on phone: Not on file    Gets together: Not on file    Attends religious service: Not on file    Active member of club or organization: Not on file    Attends meetings of clubs or organizations: Not on file    Relationship status: Not on file  Other  Topics Concern  . Not on file  Social History Narrative  . Not on file   Family History: No family history on file. Allergies: No Known Allergies Medications: See med rec.  Review of Systems: No headache, visual changes, nausea, vomiting, diarrhea, constipation, dizziness, abdominal pain, skin rash, fevers, chills, night sweats, weight loss, swollen lymph nodes, body aches, joint swelling, muscle aches, chest pain, shortness of breath, mood changes, visual or auditory hallucinations.   Objective:   General: Well Developed, well nourished, and in no acute distress.  Neuro:  Extra-ocular muscles intact, able to move all 4 extremities, sensation grossly intact.  Deep tendon reflexes tested were normal. Psych: Alert and oriented, mood congruent with affect. ENT:  Ears and nose appear unremarkable.  Hearing grossly normal. Neck: Unremarkable overall appearance, trachea midline.  No visible thyroid enlargement. Eyes: Conjunctivae and lids appear unremarkable.  Pupils equal and round. Skin: Warm and dry, no rashes noted.  Cardiovascular: Pulses palpable, no extremity edema. Neck: Negative spurling's Full neck range of motion Grip strength and sensation normal in bilateral hands Strength good C4 to T1 distribution No sensory change to C4 to T1 Reflexes normal Back Exam:  Inspection: Unremarkable  Motion: Flexion 45 deg, Extension 45 deg, Side Bending to 45 deg bilaterally,  Rotation to 45 deg bilaterally  SLR  laying: Negative  XSLR laying: Negative  Palpable tenderness: Minimal tenderness over the paralumbar muscles. FABER: negative. Sensory change: Gross sensation intact to all lumbar and sacral dermatomes.  Reflexes: 2+ at both patellar tendons, 2+ at achilles tendons, Babinski's downgoing.  Strength at foot  Plantar-flexion: 5/5 Dorsi-flexion: 5/5 Eversion: 5/5 Inversion: 5/5  Leg strength  Quad: 5/5 Hamstring: 5/5 Hip flexor: 5/5 Hip abductors: 5/5  Gait unremarkable. Left  Hip: ROM IR: 60 Deg, ER: 60 Deg, Flexion: 120 Deg, Extension: 100 Deg, Abduction: 45 Deg, Adduction: 45 Deg Strength IR: 5/5, ER: 5/5, Flexion: 5/5, Extension: 5/5, Abduction: 5/5, Adduction: 5/5 Pelvic alignment unremarkable to inspection and palpation. Standing hip rotation and gait without trendelenburg / unsteadiness. Greater trochanter without tenderness to palpation. No tenderness over piriformis. No SI joint tenderness and normal minimal SI movement.  Impression and Recommendations:   This case required medical decision making of moderate complexity.  Left lumbar radiculitis We have treated her in the past for left L5 radiculitis, getting new x-rays, ibuprofen 400 mg at night and again in the morning, formal physical therapy, she does need weekend and after hours care. Return to see me in 1 month, MR for interventional planning if no better.  DDD (degenerative disc disease), cervical X-rays, physical therapy, ibuprofen. Return to see me in 1 month. Mostly left-sided periscapular radicular symptoms. ___________________________________________ Ihor Austinhomas J. Benjamin Stainhekkekandam, M.D., ABFM., CAQSM. Primary Care and Sports Medicine [facility]  Adjunct Instructor of Family Medicine  [employer]

## 2017-10-09 NOTE — Assessment & Plan Note (Signed)
X-rays, physical therapy, ibuprofen. Return to see me in 1 month. Mostly left-sided periscapular radicular symptoms.

## 2017-10-09 NOTE — Assessment & Plan Note (Signed)
We have treated her in the past for left L5 radiculitis, getting new x-rays, ibuprofen 400 mg at night and again in the morning, formal physical therapy, she does need weekend and after hours care. Return to see me in 1 month, MR for interventional planning if no better.

## 2017-10-13 ENCOUNTER — Telehealth: Payer: Self-pay | Admitting: Sports Medicine

## 2017-10-13 NOTE — Telephone Encounter (Signed)
Routing for review. I see where one has been resulted, will make sure he is still OK with Pt starting PT.

## 2017-10-13 NOTE — Telephone Encounter (Signed)
Pt advised of results and recommendation. Verbalized understanding.

## 2017-10-13 NOTE — Telephone Encounter (Signed)
Pt. Called this morning asking that you call her. She had x-rays on Friday. She would like to know what Dr. Melvia Heaps's opinion was before she goes to physical therapy.

## 2017-10-13 NOTE — Telephone Encounter (Signed)
I is, preesh.  :-P

## 2017-11-10 ENCOUNTER — Ambulatory Visit: Payer: 59 | Admitting: Sports Medicine

## 2018-01-21 ENCOUNTER — Encounter: Payer: Self-pay | Admitting: Sports Medicine

## 2018-01-21 ENCOUNTER — Ambulatory Visit (INDEPENDENT_AMBULATORY_CARE_PROVIDER_SITE_OTHER): Payer: 59 | Admitting: Sports Medicine

## 2018-01-21 DIAGNOSIS — M170 Bilateral primary osteoarthritis of knee: Secondary | ICD-10-CM | POA: Diagnosis not present

## 2018-01-21 MED ORDER — CELEBREX 200 MG PO CAPS: ORAL_CAPSULE | ORAL | 2 refills | Status: DC

## 2018-01-21 NOTE — Assessment & Plan Note (Signed)
At this point has been through steroid injections, she had minimal relief from Monovisc, she does desire to try Orthovisc again, tells me she does not need approval for these. Before we do that I would like to try Celebrex, she prefers branded medication only. If insufficient relief of her full body pains with Celebrex at the follow-up visit we will start Orthovisc series in both knees. She did see Dr. Everardo PacificVarkey who suggested more conservative measures due to the unpredictability of knee arthroscopy in the setting of underlying osteoarthritis.

## 2018-01-21 NOTE — Progress Notes (Signed)
Subjective:    CC: Bilateral knee pain  HPI: This is a pleasant 67 year old female, we have been treating her for knee osteoarthritis, she has had Monovisc, steroid injections, NSAIDs, physical therapy, none of this seemed to help long-term, an MRI showed a complex left medial meniscal tear, she was seen by Dr. Everardo PacificVarkey who opted for further conservative measures considering concrement and osteoarthritis.  At this point she would like to discuss another NSAID and proceeding again with Visco supplementation.  I reviewed the past medical history, family history, social history, surgical history, and allergies today and no changes were needed.  Please see the problem list section below in epic for further details.  Past Medical History: Past Medical History:  Diagnosis Date  . Osteopenia    Past Surgical History: Past Surgical History:  Procedure Laterality Date  . INCONTINENCE SURGERY     Social History: Social History   Socioeconomic History  . Marital status: Married    Spouse name: Not on file  . Number of children: Not on file  . Years of education: Not on file  . Highest education level: Not on file  Occupational History  . Not on file  Social Needs  . Financial resource strain: Not on file  . Food insecurity:    Worry: Not on file    Inability: Not on file  . Transportation needs:    Medical: Not on file    Non-medical: Not on file  Tobacco Use  . Smoking status: Never Smoker  . Smokeless tobacco: Never Used  Substance and Sexual Activity  . Alcohol use: No  . Drug use: No  . Sexual activity: Not on file  Lifestyle  . Physical activity:    Days per week: Not on file    Minutes per session: Not on file  . Stress: Not on file  Relationships  . Social connections:    Talks on phone: Not on file    Gets together: Not on file    Attends religious service: Not on file    Active member of club or organization: Not on file    Attends meetings of clubs or  organizations: Not on file    Relationship status: Not on file  Other Topics Concern  . Not on file  Social History Narrative  . Not on file   Family History: No family history on file. Allergies: No Known Allergies Medications: See med rec.  Review of Systems: No fevers, chills, night sweats, weight loss, chest pain, or shortness of breath.   Objective:    General: Well Developed, well nourished, and in no acute distress.  Neuro: Alert and oriented x3, extra-ocular muscles intact, sensation grossly intact.  HEENT: Normocephalic, atraumatic, pupils equal round reactive to light, neck supple, no masses, no lymphadenopathy, thyroid nonpalpable.  Skin: Warm and dry, no rashes. Cardiac: Regular rate and rhythm, no murmurs rubs or gallops, no lower extremity edema.  Respiratory: Clear to auscultation bilaterally. Not using accessory muscles, speaking in full sentences. Bilateral knees: Normal to inspection with no erythema or effusion or obvious bony abnormalities. Palpation normal with no warmth or joint line tenderness or patellar tenderness or condyle tenderness. ROM normal in flexion and extension and lower leg rotation. Ligaments with solid consistent endpoints including ACL, PCL, LCL, MCL. Negative Mcmurray's and provocative meniscal tests. Non painful patellar compression. Patellar and quadriceps tendons unremarkable. Hamstring and quadriceps strength is normal.  Impression and Recommendations:    Primary osteoarthritis of both knees At this point  has been through steroid injections, she had minimal relief from Monovisc, she does desire to try Orthovisc again, tells me she does not need approval for these. Before we do that I would like to try Celebrex, she prefers branded medication only. If insufficient relief of her full body pains with Celebrex at the follow-up visit we will start Orthovisc series in both knees. She did see Dr. Everardo Pacific who suggested more conservative  measures due to the unpredictability of knee arthroscopy in the setting of underlying osteoarthritis.  I spent 40 minutes with this patient, greater than 50% was face-to-face time counseling regarding the above diagnoses ___________________________________________ Ihor Austin. Benjamin Stain, M.D., ABFM., CAQSM. Primary Care and Sports Medicine [facility]  Adjunct Instructor of Family Medicine  [employer]

## 2018-01-25 ENCOUNTER — Telehealth: Payer: Self-pay

## 2018-01-25 DIAGNOSIS — M170 Bilateral primary osteoarthritis of knee: Secondary | ICD-10-CM

## 2018-01-25 MED ORDER — CELEBREX 200 MG PO CAPS: 200.0000 mg | ORAL_CAPSULE | Freq: Every day | ORAL | 2 refills | Status: DC | PRN

## 2018-01-25 NOTE — Telephone Encounter (Signed)
Pt was seen last week and had requested that her RX for Celebrex be sent in for name brand only.   Pt's RX was not covered by ins and she is now asking that we give [facility] the OK to fill medication for generic.   [facility]ist Dimas AguasHoward, gave new verbal RX to fill for generic

## 2018-03-10 ENCOUNTER — Ambulatory Visit (INDEPENDENT_AMBULATORY_CARE_PROVIDER_SITE_OTHER): Payer: 59

## 2018-03-10 ENCOUNTER — Ambulatory Visit (INDEPENDENT_AMBULATORY_CARE_PROVIDER_SITE_OTHER): Payer: 59 | Admitting: Sports Medicine

## 2018-03-10 ENCOUNTER — Encounter: Payer: Self-pay | Admitting: Sports Medicine

## 2018-03-10 DIAGNOSIS — M170 Bilateral primary osteoarthritis of knee: Secondary | ICD-10-CM

## 2018-03-10 DIAGNOSIS — M25552 Pain in left hip: Secondary | ICD-10-CM | POA: Diagnosis not present

## 2018-03-10 DIAGNOSIS — M7062 Trochanteric bursitis, left hip: Secondary | ICD-10-CM | POA: Diagnosis not present

## 2018-03-10 DIAGNOSIS — M7061 Trochanteric bursitis, right hip: Secondary | ICD-10-CM

## 2018-03-10 DIAGNOSIS — M25551 Pain in right hip: Secondary | ICD-10-CM | POA: Diagnosis not present

## 2018-03-10 MED ORDER — CELECOXIB 100 MG PO CAPS: 100.0000 mg | ORAL_CAPSULE | Freq: Two times a day (BID) | ORAL | 3 refills | Status: DC

## 2018-03-10 NOTE — Progress Notes (Signed)
Subjective:    CC: Hip pain  HPI: Bilateral knee osteoarthritis: Pain improved with Celebrex, she in fact is only using it once every 2 to 3 days, would like to go down on the dose.  She has unfortunately started to have pain in both hips, lateral aspect, moderate, persistent without radiation.  Worse with sitting for long periods of time and is exacerbated when she gets up.  I reviewed the past medical history, family history, social history, surgical history, and allergies today and no changes were needed.  Please see the problem list section below in epic for further details.  Past Medical History: Past Medical History:  Diagnosis Date  . Osteopenia    Past Surgical History: Past Surgical History:  Procedure Laterality Date  . INCONTINENCE SURGERY     Social History: Social History   Socioeconomic History  . Marital status: Married    Spouse name: Not on file  . Number of children: Not on file  . Years of education: Not on file  . Highest education level: Not on file  Occupational History  . Not on file  Social Needs  . Financial resource strain: Not on file  . Food insecurity:    Worry: Not on file    Inability: Not on file  . Transportation needs:    Medical: Not on file    Non-medical: Not on file  Tobacco Use  . Smoking status: Never Smoker  . Smokeless tobacco: Never Used  Substance and Sexual Activity  . Alcohol use: No  . Drug use: No  . Sexual activity: Not on file  Lifestyle  . Physical activity:    Days per week: Not on file    Minutes per session: Not on file  . Stress: Not on file  Relationships  . Social connections:    Talks on phone: Not on file    Gets together: Not on file    Attends religious service: Not on file    Active member of club or organization: Not on file    Attends meetings of clubs or organizations: Not on file    Relationship status: Not on file  Other Topics Concern  . Not on file  Social History Narrative  . Not on  file   Family History: No family history on file. Allergies: No Known Allergies Medications: See med rec.  Review of Systems: No fevers, chills, night sweats, weight loss, chest pain, or shortness of breath.   Objective:    General: Well Developed, well nourished, and in no acute distress.  Neuro: Alert and oriented x3, extra-ocular muscles intact, sensation grossly intact.  HEENT: Normocephalic, atraumatic, pupils equal round reactive to light, neck supple, no masses, no lymphadenopathy, thyroid nonpalpable.  Skin: Warm and dry, no rashes. Cardiac: Regular rate and rhythm, no murmurs rubs or gallops, no lower extremity edema.  Respiratory: Clear to auscultation bilaterally. Not using accessory muscles, speaking in full sentences. Bilateral hips: ROM IR: 60 Deg, ER: 60 Deg, Flexion: 120 Deg, Extension: 100 Deg, Abduction: 45 Deg, Adduction: 45 Deg Strength IR: 5/5, ER: 5/5, Flexion: 5/5, Extension: 5/5, Abduction: 5/5, Adduction: 5/5 Pelvic alignment unremarkable to inspection and palpation. Standing hip rotation and gait without trendelenburg / unsteadiness. Greater trochanters with tenderness to palpation. No tenderness over piriformis. No SI joint tenderness and normal minimal SI movement.  Impression and Recommendations:    Trochanteric bursitis of both hips Pain is minimal. Bilateral x-rays. Adding rehab exercises, she does desire to drop down to 100  mg on her Celebrex.  Primary osteoarthritis of both knees Knees are doing okay, no need to proceed with further Visco supplementation. ___________________________________________ Ihor Austin. Benjamin Stain, M.D., ABFM., CAQSM. Primary Care and Sports Medicine [facility]  Adjunct Instructor of Family Medicine  [employer]

## 2018-03-10 NOTE — Assessment & Plan Note (Signed)
Pain is minimal. Bilateral x-rays. Adding rehab exercises, she does desire to drop down to 100 mg on her Celebrex.

## 2018-03-10 NOTE — Assessment & Plan Note (Signed)
Knees are doing okay, no need to proceed with further Visco supplementation.

## 2018-07-16 IMAGING — MR MR KNEE*L* W/O CM
6 series · 40 of 40 positions shown · non-contrast
Comparison: None.

CLINICAL DATA: Left knee pain and tenderness.

EXAM:
MRI OF THE LEFT KNEE WITHOUT CONTRAST
TECHNIQUE: Multiplanar, multisequence MR imaging of the knee was performed. No
intravenous contrast was administered.

[Series 3: PD fat-sat · axial · 3.0mm · 0.33mm/px · z∈[-62,+51]mm · 7 of 35 slices shown (1 of 4)]
[im 1/35]
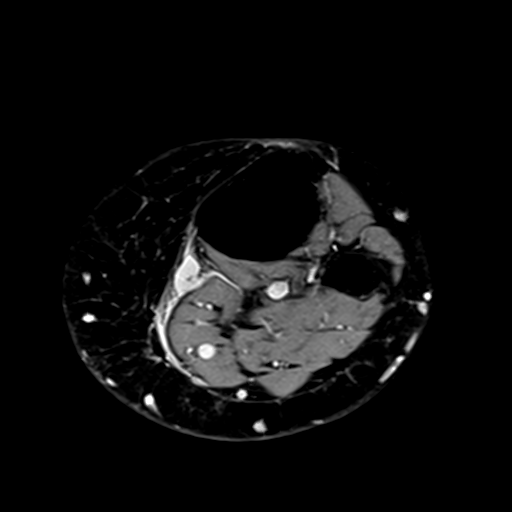
[im 6/35]
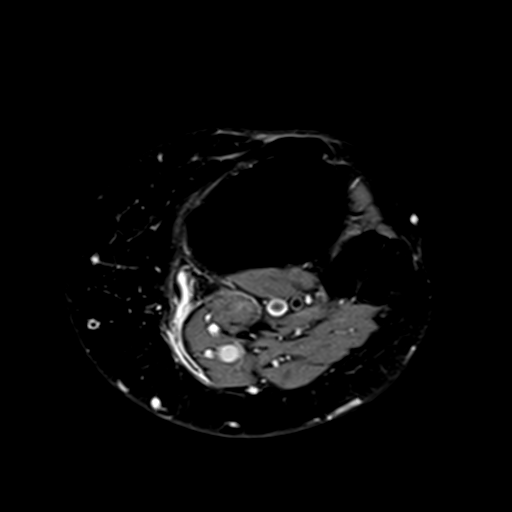
[im 12/35]
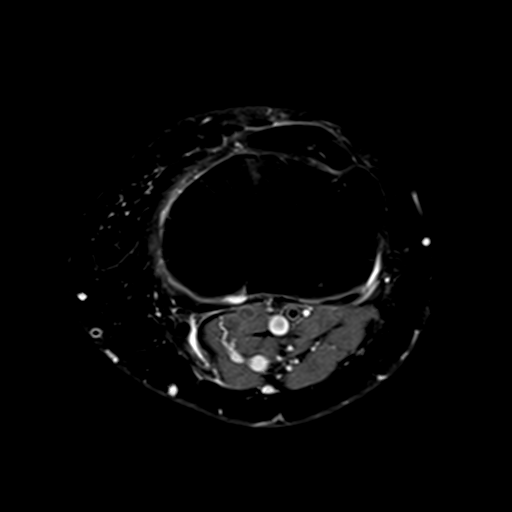
[im 18/35]
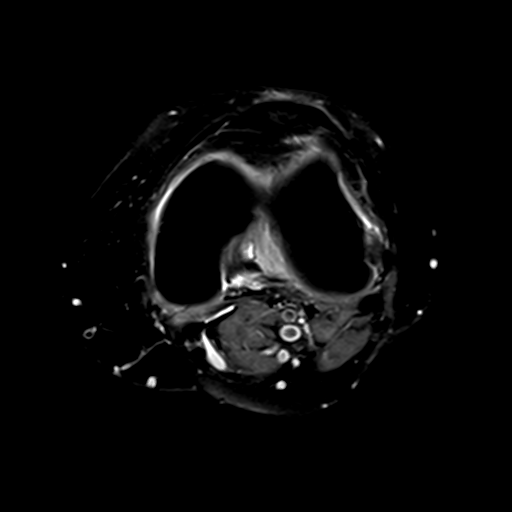
[im 23/35]
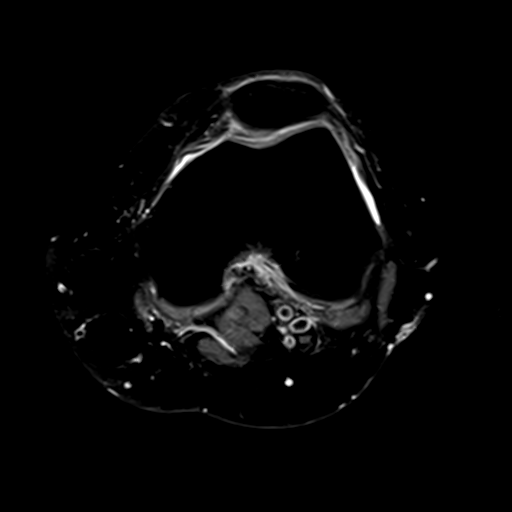
[im 29/35]
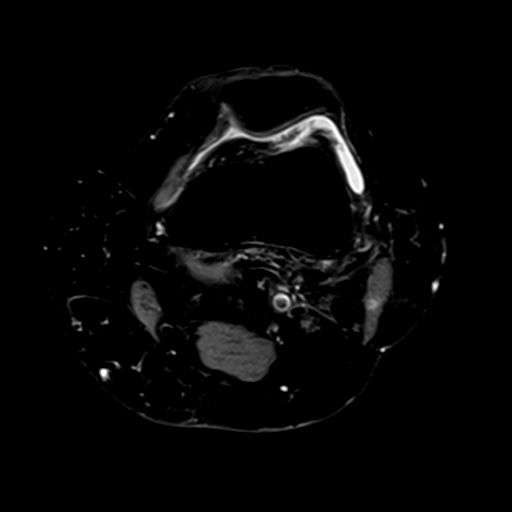
[im 35/35]
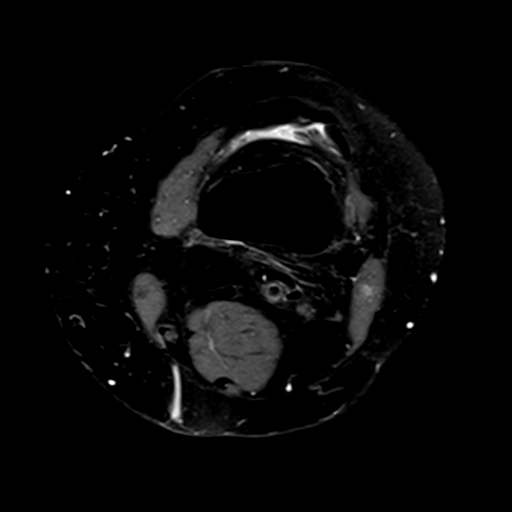

[Series 4: T1 · coronal · 3.0mm · 0.50mm/px · 7 of 31 slices shown]
[im 1/31]
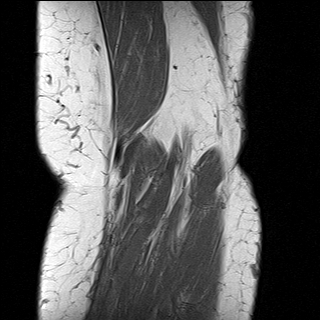
[im 6/31]
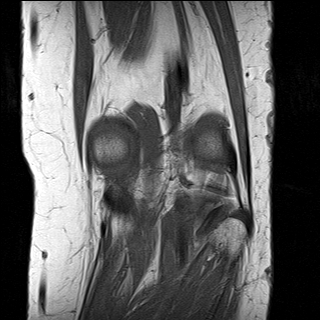
[im 11/31]
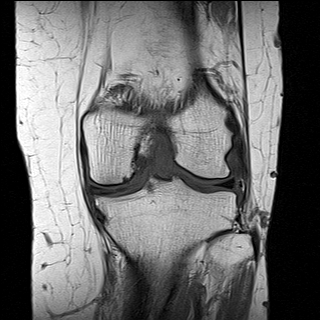
[im 16/31]
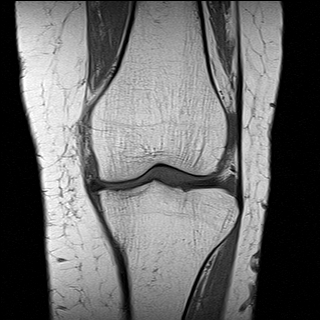
[im 21/31]
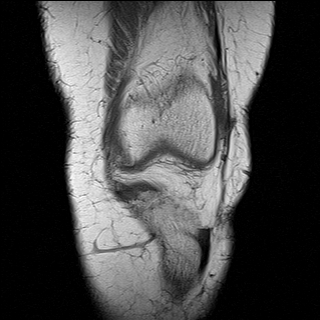
[im 26/31]
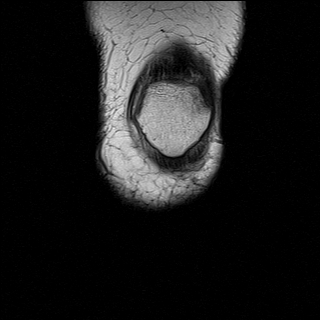
[im 31/31]
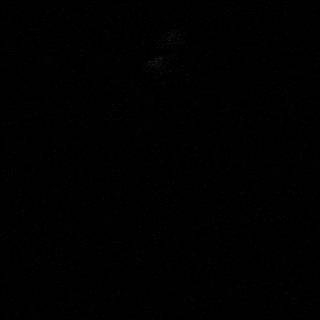

[Series 5: T2 fat-sat · coronal · 3.0mm · 0.50mm/px · 7 of 31 slices shown]
[im 1/31]
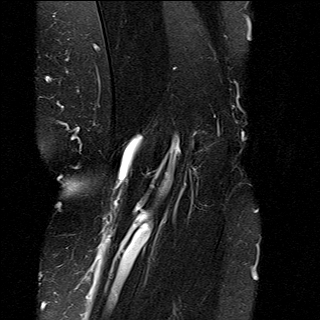
[im 6/31]
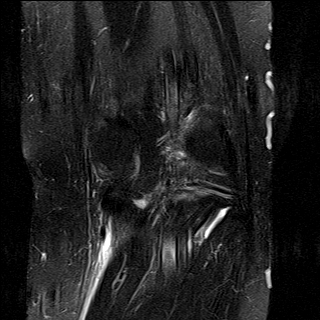
[im 11/31]
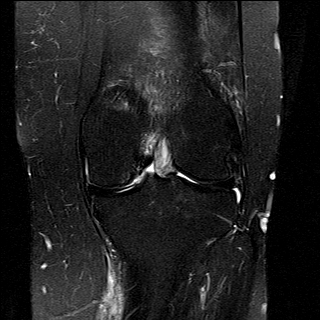
[im 16/31]
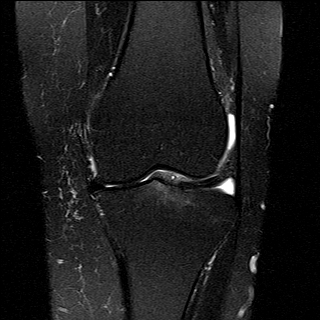
[im 21/31]
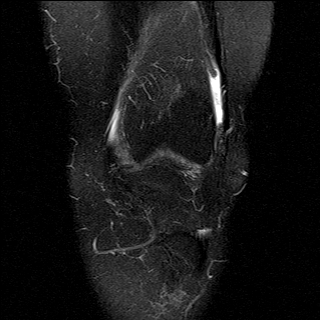
[im 26/31]
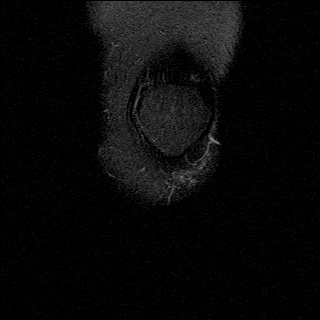
[im 31/31]
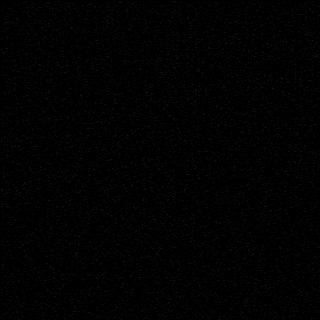

[Series 6: PD fat-sat · coronal · 3.0mm · 0.62mm/px · 7 of 31 slices shown (2 of 4)]
[im 1/31]
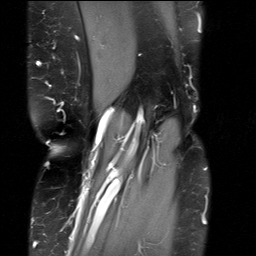
[im 6/31]
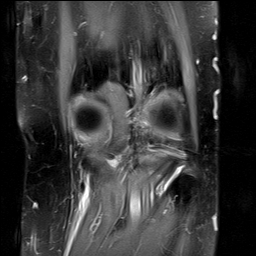
[im 11/31]
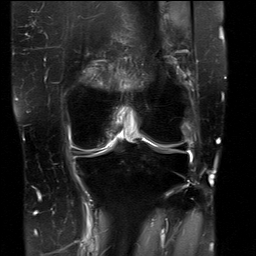
[im 16/31]
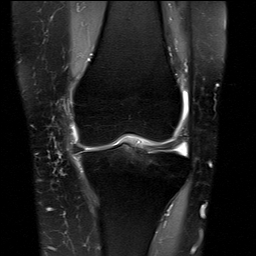
[im 21/31]
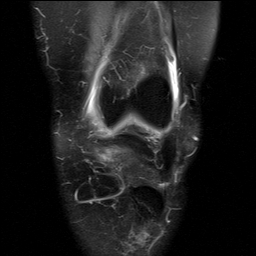
[im 26/31]
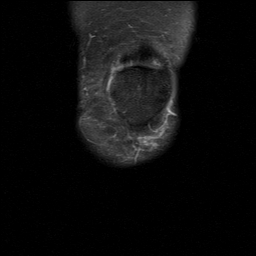
[im 31/31]
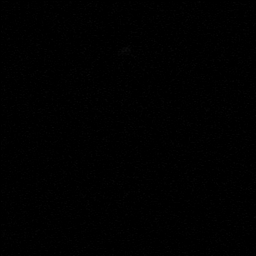

[Series 7: PD fat-sat · sagittal · 3.0mm · 0.62mm/px · 8 of 34 slices shown (3 of 4)]
[im 1/34]
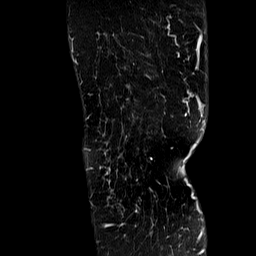
[im 5/34]
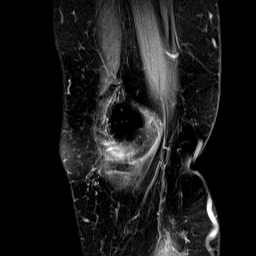
[im 10/34]
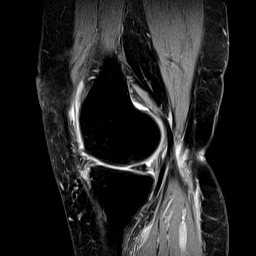
[im 15/34]
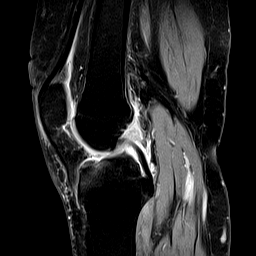
[im 19/34]
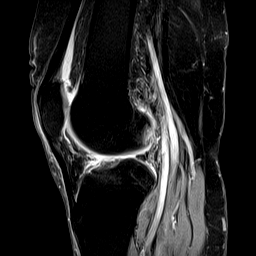
[im 24/34]
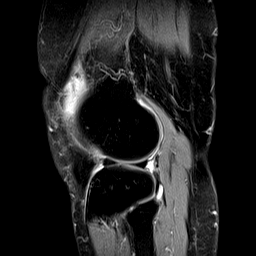
[im 29/34]
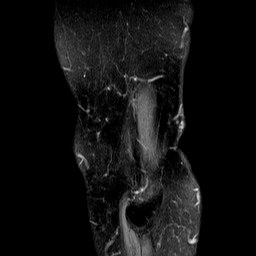
[im 34/34]
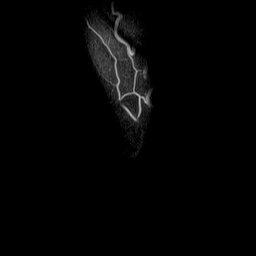

[Series 8: PD fat-sat · coronal · 2.0mm · 0.62mm/px · 4 of 19 slices shown (4 of 4)]
[im 1/19]
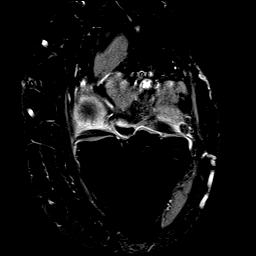
[im 7/19]
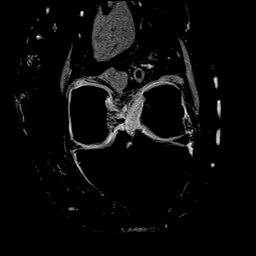
[im 13/19]
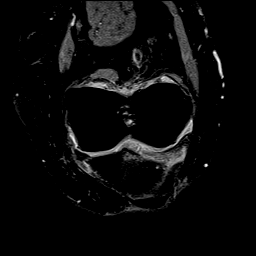
[im 19/19]
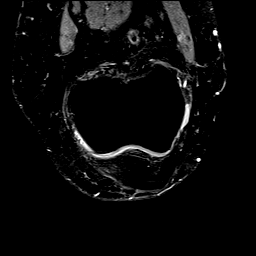

[40 of 40 positions shown; findings below may reference images not displayed]

FINDINGS: MENISCI

Medial meniscus: There is a subtle horizontal tear of the superior
surface of the posterior horn. On image 10 of series 7 there are
small superior surface and undersurface tears of the posterior horn.

Lateral meniscus:  Normal.

LIGAMENTS

Cruciates: Extensive mucoid degeneration of the intact ACL. PCL is
normal.

Collaterals:  Normal.

CARTILAGE

Patellofemoral:  Tiny focal fissure in the apex of the patella.

Medial: Small irregular areas of full-thickness cartilage loss of
the posterior central aspect of the femoral condyle.

Lateral:  Normal.

Joint:  Tiny joint effusion.  Hoffa's fat pad is normal.  No plica.

Popliteal Fossa: Small ruptured Baker's cyst with fluid extending
inferiorly superficial to the medial head of the gastrocnemius.
Intact popliteus tendon.

Extensor Mechanism:  Normal.

Bones: 8 mm small chondroid lesion in the lateral aspect of the
proximal tibial metaphysis, not felt to be significant. Slight edema
in the proximal tibia at ACL insertion consistent within
enthesopathy.

Other: None
IMPRESSION: 1. Chronic mucoid degeneration of the ACL with enthesopathy at the
tibial insertion.
2. Subtle complex tear of the posterior horn of the medial meniscus.
3. Focal minimal chondromalacia of the medial femoral condyle.
4. Small ruptured Baker's cyst.

## 2019-07-01 LAB — COLOGUARD: Cologuard: NEGATIVE

## 2019-07-07 DIAGNOSIS — H04211 Epiphora due to excess lacrimation, right lacrimal gland: Secondary | ICD-10-CM | POA: Insufficient documentation

## 2019-07-07 DIAGNOSIS — H2513 Age-related nuclear cataract, bilateral: Secondary | ICD-10-CM | POA: Insufficient documentation

## 2019-07-07 DIAGNOSIS — H25013 Cortical age-related cataract, bilateral: Secondary | ICD-10-CM | POA: Insufficient documentation

## 2019-07-07 DIAGNOSIS — H01009 Unspecified blepharitis unspecified eye, unspecified eyelid: Secondary | ICD-10-CM | POA: Insufficient documentation

## 2019-07-07 DIAGNOSIS — H02412 Mechanical ptosis of left eyelid: Secondary | ICD-10-CM | POA: Insufficient documentation

## 2020-01-16 DIAGNOSIS — H9312 Tinnitus, left ear: Secondary | ICD-10-CM | POA: Insufficient documentation

## 2020-01-16 DIAGNOSIS — H903 Sensorineural hearing loss, bilateral: Secondary | ICD-10-CM | POA: Insufficient documentation

## 2020-02-29 ENCOUNTER — Ambulatory Visit: Payer: 59 | Admitting: Sports Medicine

## 2020-11-22 ENCOUNTER — Ambulatory Visit (INDEPENDENT_AMBULATORY_CARE_PROVIDER_SITE_OTHER): Payer: 59 | Admitting: Sports Medicine

## 2020-11-22 ENCOUNTER — Other Ambulatory Visit: Payer: Self-pay

## 2020-11-22 DIAGNOSIS — M6788 Other specified disorders of synovium and tendon, other site: Secondary | ICD-10-CM | POA: Diagnosis not present

## 2020-11-22 MED ORDER — NITROGLYCERIN 0.2 MG/HR TD PT24: MEDICATED_PATCH | TRANSDERMAL | 11 refills | Status: DC

## 2020-11-22 NOTE — Progress Notes (Signed)
    Procedures performed today:    None.  Independent interpretation of notes and tests performed by another provider:   None.  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 She has tried some stretches at home but is not getting any improvement. On exam she has a bit of a Haglund's deformity, tenderness at the Achilles insertion. Negative Thompson's test. We will treat this as typical, topical nitroglycerin patches, heel lifts for all of her shoes, eccentric rehabilitation exercises, return to see me in 6 weeks, she does understand this can take 2 to 3 months to get better.     ___________________________________________ Ihor Austin. Benjamin Stain, M.D., ABFM., CAQSM. Primary Care and Sports Medicine [facility]  Adjunct Instructor of Family Medicine  [employer]

## 2020-11-22 NOTE — Assessment & Plan Note (Signed)
This is a pleasant 70 year old female, she has had several weeks of pain in the back of her left heel at the Achilles insertion. She has tried some stretches at home but is not getting any improvement. On exam she has a bit of a Haglund's deformity, tenderness at the Achilles insertion. Negative Thompson's test. We will treat this as typical, topical nitroglycerin patches, heel lifts for all of her shoes, eccentric rehabilitation exercises, return to see me in 6 weeks, she does understand this can take 2 to 3 months to get better.

## 2020-11-22 NOTE — Patient Instructions (Signed)
Begin with easy walking, heel, toe and backwards  Do calf raises on a step:  First lower and then raise on 1 foot  If this is painful, lower on 1 foot, do the heel raise on both feet Begin with 3 sets of 10 repetitions  Increase by 5 repetitions every 3 days  Goal is 3 sets of 30 repetitions  Do with both straight knee and knee at 20 degrees of flexion  If pain persists, once you can do 3 sets of 30 without weight, add backpack with 5 lbs.  Increase by 5 lbs per week to max of 30 lbs for 3 sets of 15   

## 2021-01-01 ENCOUNTER — Telehealth: Payer: Self-pay

## 2021-01-01 ENCOUNTER — Ambulatory Visit (INDEPENDENT_AMBULATORY_CARE_PROVIDER_SITE_OTHER): Payer: 59 | Admitting: Sports Medicine

## 2021-01-01 ENCOUNTER — Ambulatory Visit (INDEPENDENT_AMBULATORY_CARE_PROVIDER_SITE_OTHER): Payer: 59

## 2021-01-01 ENCOUNTER — Other Ambulatory Visit: Payer: Self-pay

## 2021-01-01 DIAGNOSIS — M7541 Impingement syndrome of right shoulder: Secondary | ICD-10-CM

## 2021-01-01 DIAGNOSIS — M6788 Other specified disorders of synovium and tendon, other site: Secondary | ICD-10-CM | POA: Diagnosis not present

## 2021-01-01 MED ORDER — IBUPROFEN 400 MG PO TABS: 400.0000 mg | ORAL_TABLET | Freq: Three times a day (TID) | ORAL | 11 refills | Status: DC | PRN

## 2021-01-01 NOTE — Telephone Encounter (Signed)
Patient returned call to office after her appt. She requested a prescription for Advil 400mg  be sent to pharmacy for however many days you wanted her to take it.

## 2021-01-01 NOTE — Telephone Encounter (Signed)
Done

## 2021-01-01 NOTE — Telephone Encounter (Signed)
Patient aware prescription sent in.  

## 2021-01-01 NOTE — Assessment & Plan Note (Signed)
Haglund deformity, insertional Achilles tendinosis, topical nitroglycerin patches have been effective, heel lifts, eccentric rehabilitation, she is a lot better but not perfect. She understands this can take 2 to 3 months and its only been about 6 weeks, return to see me in 6 more weeks, she does plan to go up to one half nitroglycerin patch. She had no adverse effects from the quarter patch.

## 2021-01-01 NOTE — Progress Notes (Signed)
    Procedures performed today:    None.  Independent interpretation of notes and tests performed by another provider:   None.  Brief History, Exam, Impression, and Recommendations:    Impingement syndrome of shoulder, right This is a pleasant 70 year old female, the past several months has had pain in her right shoulder over the deltoid, worse with overhead activities, occasionally keeping her up, and occasional 200 mg ibuprofen seems to take care of this. On exam she has impingement signs, adding x-rays, rotator cuff conditioning. Of note she had a similar problem on the left side that responded to an injection about 4-5 years ago.  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 She understands this can take 2 to 3 months and its only been about 6 weeks, return to see me in 6 more weeks, she does plan to go up to one half nitroglycerin patch. She had no adverse effects from the quarter patch.    ___________________________________________ Ihor Austin. Benjamin Stain, M.D., ABFM., CAQSM. Primary Care and Sports Medicine [facility] [facility]  Adjunct Instructor of Family Medicine  [employer]

## 2021-01-01 NOTE — Assessment & Plan Note (Signed)
This is a pleasant 70 year old female, the past several months has had pain in her right shoulder over the deltoid, worse with overhead activities, occasionally keeping her up, and occasional 200 mg ibuprofen seems to take care of this. On exam she has impingement signs, adding x-rays, rotator cuff conditioning. Of note she had a similar problem on the left side that responded to an injection about 4-5 years ago.

## 2021-01-07 ENCOUNTER — Ambulatory Visit: Payer: 59 | Admitting: Sports Medicine

## 2021-01-17 ENCOUNTER — Other Ambulatory Visit: Payer: Self-pay

## 2021-01-17 ENCOUNTER — Ambulatory Visit (INDEPENDENT_AMBULATORY_CARE_PROVIDER_SITE_OTHER): Payer: 59 | Admitting: Sports Medicine

## 2021-01-17 DIAGNOSIS — Z299 Encounter for prophylactic measures, unspecified: Secondary | ICD-10-CM | POA: Diagnosis not present

## 2021-01-17 NOTE — Progress Notes (Signed)
    Procedures performed today:    None.  Independent interpretation of notes and tests performed by another provider:   None.  Brief History, Exam, Impression, and Recommendations:    Encounter for paperwork Today I filled out FMLA forms for Achilles tendinosis, bilateral rotator cuff dysfunction, knee arthralgia.  I spent 15 minutes of total time managing this patient today, this includes chart review, face to face, and non-face to face time.  Specifically we discussed her disease processes and filled out paperwork.  ___________________________________________ Ihor Austin. Benjamin Stain, M.D., ABFM., CAQSM. Primary Care and Sports Medicine [place] [facility]  Adjunct Instructor of Family Medicine  [employer]

## 2021-01-17 NOTE — Assessment & Plan Note (Signed)
Today I filled out FMLA forms for Achilles tendinosis, bilateral rotator cuff dysfunction, knee arthralgia.

## 2021-01-22 ENCOUNTER — Ambulatory Visit: Payer: 59 | Admitting: Medical-Surgical

## 2021-02-05 ENCOUNTER — Ambulatory Visit (INDEPENDENT_AMBULATORY_CARE_PROVIDER_SITE_OTHER): Payer: 59 | Admitting: Sports Medicine

## 2021-02-05 DIAGNOSIS — M7541 Impingement syndrome of right shoulder: Secondary | ICD-10-CM

## 2021-02-05 DIAGNOSIS — M6788 Other specified disorders of synovium and tendon, other site: Secondary | ICD-10-CM

## 2021-02-05 NOTE — Assessment & Plan Note (Signed)
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 Continue nitroglycerin patches at one half patch, heel lifts, adding an air heel brace, return to see me in 6 weeks.  We filled out her FMLA paperwork again.

## 2021-02-05 NOTE — Assessment & Plan Note (Signed)
Continues to do okay with ibuprofen, conditioning exercises, she did have an injection on the left side in the past that responded to an injection 4 to 5 years ago, not interested in an injection today.

## 2021-02-05 NOTE — Progress Notes (Signed)
    Procedures performed today:    None.  Independent interpretation of notes and tests performed by another provider:   None.  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 Continue nitroglycerin patches at one half patch, heel lifts, adding an air heel brace, return to see me in 6 weeks.  We filled out her FMLA paperwork again.  Impingement syndrome of shoulder, right Continues to do okay with ibuprofen, conditioning exercises, she did have an injection on the left side in the past that responded to an injection 4 to 5 years ago, not interested in an injection today.  I spent 30 minutes of total time managing this patient today, this includes chart review, face to face, and non-face to face time.  Specifically we discussed her disease processes and filled out paperwork with her.   ___________________________________________ Ihor Austin. Benjamin Stain, M.D., ABFM., CAQSM. Primary Care and Sports Medicine [facility] [facility]  Adjunct Instructor of Family Medicine  [employer]

## 2021-02-12 ENCOUNTER — Ambulatory Visit: Payer: 59 | Admitting: Sports Medicine

## 2021-02-20 ENCOUNTER — Ambulatory Visit (INDEPENDENT_AMBULATORY_CARE_PROVIDER_SITE_OTHER): Payer: 59 | Admitting: Medical-Surgical

## 2021-02-20 ENCOUNTER — Encounter: Payer: Self-pay | Admitting: Medical-Surgical

## 2021-02-20 ENCOUNTER — Other Ambulatory Visit: Payer: Self-pay

## 2021-02-20 VITALS — BP 130/82 | HR 76 | Resp 20 | Ht 64.0 in | Wt 139.1 lb

## 2021-02-20 DIAGNOSIS — Z1231 Encounter for screening mammogram for malignant neoplasm of breast: Secondary | ICD-10-CM

## 2021-02-20 DIAGNOSIS — Z0000 Encounter for general adult medical examination without abnormal findings: Secondary | ICD-10-CM

## 2021-02-20 DIAGNOSIS — M810 Age-related osteoporosis without current pathological fracture: Secondary | ICD-10-CM | POA: Diagnosis not present

## 2021-02-20 DIAGNOSIS — Z7689 Persons encountering health services in other specified circumstances: Secondary | ICD-10-CM

## 2021-02-20 LAB — CBC WITH DIFFERENTIAL/PLATELET
Absolute Monocytes: 631 cells/uL (ref 200–950)
Basophils Absolute: 38 cells/uL (ref 0–200)
Basophils Relative: 0.5 %
Eosinophils Absolute: 137 cells/uL (ref 15–500)
Eosinophils Relative: 1.8 %
HCT: 42.5 % (ref 35.0–45.0)
Hemoglobin: 13.9 g/dL (ref 11.7–15.5)
Lymphs Abs: 2371 cells/uL (ref 850–3900)
MCH: 28 pg (ref 27.0–33.0)
MCHC: 32.7 g/dL (ref 32.0–36.0)
MCV: 85.7 fL (ref 80.0–100.0)
MPV: 11.5 fL (ref 7.5–12.5)
Monocytes Relative: 8.3 %
Neutro Abs: 4423 cells/uL (ref 1500–7800)
Neutrophils Relative %: 58.2 %
Platelets: 258 10*3/uL (ref 140–400)
RBC: 4.96 10*6/uL (ref 3.80–5.10)
RDW: 12.8 % (ref 11.0–15.0)
Total Lymphocyte: 31.2 %
WBC: 7.6 10*3/uL (ref 3.8–10.8)

## 2021-02-20 LAB — COMPLETE METABOLIC PANEL WITH GFR
AG Ratio: 1.2 (calc) (ref 1.0–2.5)
ALT: 12 U/L (ref 6–29)
AST: 18 U/L (ref 10–35)
Albumin: 4.1 g/dL (ref 3.6–5.1)
Alkaline phosphatase (APISO): 85 U/L (ref 37–153)
BUN: 12 mg/dL (ref 7–25)
CO2: 31 mmol/L (ref 20–32)
Calcium: 9.4 mg/dL (ref 8.6–10.4)
Chloride: 100 mmol/L (ref 98–110)
Creat: 0.64 mg/dL (ref 0.50–1.05)
Globulin: 3.3 g/dL (calc) (ref 1.9–3.7)
Glucose, Bld: 85 mg/dL (ref 65–99)
Potassium: 4.2 mmol/L (ref 3.5–5.3)
Sodium: 137 mmol/L (ref 135–146)
Total Bilirubin: 0.4 mg/dL (ref 0.2–1.2)
Total Protein: 7.4 g/dL (ref 6.1–8.1)
eGFR: 96 mL/min/{1.73_m2} (ref >=60)

## 2021-02-20 LAB — LIPID PANEL
Cholesterol: 158 mg/dL (ref <200)
HDL: 57 mg/dL (ref >=50)
LDL Cholesterol (Calc): 84 mg/dL (calc)
Non-HDL Cholesterol (Calc): 101 mg/dL (calc) (ref <130)
Total CHOL/HDL Ratio: 2.8 (calc) (ref <5.0)
Triglycerides: 78 mg/dL (ref <150)

## 2021-02-20 NOTE — Patient Instructions (Signed)
Preventive Care 68 Years and Older, Female Preventive care refers to lifestyle choices and visits with your health care provider that can promote health and wellness. This includes: A yearly physical exam. This is also called an annual wellness visit. Regular dental and eye exams. Immunizations. Screening for certain conditions. Healthy lifestyle choices, such as: Eating a healthy diet. Getting regular exercise. Not using drugs or products that contain nicotine and tobacco. Limiting alcohol use. What can I expect for my preventive care visit? Physical exam Your health care provider will check your: Height and weight. These may be used to calculate your BMI (body mass index). BMI is a measurement that tells if you are at a healthy weight. Heart rate and blood pressure. Body temperature. Skin for abnormal spots. Counseling Your health care provider may ask you questions about your: Past medical problems. Family's medical history. Alcohol, tobacco, and drug use. Emotional well-being. Home life and relationship well-being. Sexual activity. Diet, exercise, and sleep habits. History of falls. Memory and ability to understand (cognition). Work and work Statistician. Pregnancy and menstrual history. Access to firearms. What immunizations do I need?  Vaccines are usually given at various ages, according to a schedule. Your health care provider will recommend vaccines for you based on your age, medicalhistory, and lifestyle or other factors, such as travel or where you work. What tests do I need? Blood tests Lipid and cholesterol levels. These may be checked every 5 years, or more often depending on your overall health. Hepatitis C test. Hepatitis B test. Screening Lung cancer screening. You may have this screening every year starting at age 44 if you have a 30-pack-year history of smoking and currently smoke or have quit within the past 15 years. Colorectal cancer screening. All  adults should have this screening starting at age 39 and continuing until age 65. Your health care provider may recommend screening at age 61 if you are at increased risk. You will have tests every 1-10 years, depending on your results and the type of screening test. Diabetes screening. This is done by checking your blood sugar (glucose) after you have not eaten for a while (fasting). You may have this done every 1-3 years. Mammogram. This may be done every 1-2 years. Talk with your health care provider about how often you should have regular mammograms. Abdominal aortic aneurysm (AAA) screening. You may need this if you are a current or former smoker. BRCA-related cancer screening. This may be done if you have a family history of breast, ovarian, tubal, or peritoneal cancers. Other tests STD (sexually transmitted disease) testing, if you are at risk. Bone density scan. This is done to screen for osteoporosis. You may have this done starting at age 54. Talk with your health care provider about your test results, treatment options,and if necessary, the need for more tests. Follow these instructions at home: Eating and drinking  Eat a diet that includes fresh fruits and vegetables, whole grains, lean protein, and low-fat dairy products. Limit your intake of foods with high amounts of sugar, saturated fats, and salt. Take vitamin and mineral supplements as recommended by your health care provider. Do not drink alcohol if your health care provider tells you not to drink. If you drink alcohol: Limit how much you have to 0-1 drink a day. Be aware of how much alcohol is in your drink. In the [place] one drink equals one 12 oz bottle of beer (355 mL), one 5 oz glass of wine (148 mL), or one 1  oz glass of hard liquor (44 mL).  Lifestyle Take daily care of your teeth and gums. Brush your teeth every morning and night with fluoride toothpaste. Floss one time each day. Stay active. Exercise for at  least 30 minutes 5 or more days each week. Do not use any products that contain nicotine or tobacco, such as cigarettes, e-cigarettes, and chewing tobacco. If you need help quitting, ask your health care provider. Do not use drugs. If you are sexually active, practice safe sex. Use a condom or other form of protection in order to prevent STIs (sexually transmitted infections). Talk with your health care provider about taking a low-dose aspirin or statin. Find healthy ways to cope with stress, such as: Meditation, yoga, or listening to music. Journaling. Talking to a trusted person. Spending time with friends and family. Safety Always wear your seat belt while driving or riding in a vehicle. Do not drive: If you have been drinking alcohol. Do not ride with someone who has been drinking. When you are tired or distracted. While texting. Wear a helmet and other protective equipment during sports activities. If you have firearms in your house, make sure you follow all gun safety procedures. What's next? Visit your health care provider once a year for an annual wellness visit. Ask your health care provider how often you should have your eyes and teeth checked. Stay up to date on all vaccines. This information is not intended to replace advice given to you by your health care provider. Make sure you discuss any questions you have with your healthcare provider. Document Revised: 06/06/2020 Document Reviewed: 06/10/2018 Elsevier Patient Education  2022 [employer].

## 2021-02-20 NOTE — Progress Notes (Signed)
New Patient Office Visit  Subjective:  Patient ID: Brooke Price, female    DOB: 1951-05-15  Age: 70 y.o. MRN: [id number]  CC:  Chief Complaint  Patient presents with   Establish Care    HPI Brooke Price presents to establish care.  She is a very pleasant 70 year old female who would like to have an annual physical today as she has no chronic conditions outside of osteoporosis.  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  Social History   Socioeconomic History   Marital status: Married    Spouse name: Not on file   Number of children: Not on file   Years of education: Not on file   Highest education level: Not on file  Occupational History   Not on file  Tobacco Use   Smoking status: Never   Smokeless tobacco: Never  Substance and Sexual Activity   Alcohol use: No   Drug use: No   Sexual activity: Not on file  Other Topics Concern   Not on file  Social History Narrative   Not on file   Social Determinants of Health   Financial Resource Strain: Not on file  Food Insecurity: Not on file  Transportation Needs: Not on file  Physical Activity: Not on file  Stress: Not on file  Social Connections: Not on file  Intimate Partner Violence: Not on file    ROS Review of Systems  Constitutional:  Negative for chills, fatigue, fever and unexpected weight change.   HENT:  Negative for congestion, rhinorrhea, sinus pressure and sore throat.   Respiratory:  Negative for cough, chest tightness and shortness of breath.   Cardiovascular:  Negative for chest pain, palpitations and leg swelling.  Gastrointestinal:  Negative for abdominal pain, constipation, diarrhea, nausea and vomiting.  Endocrine: Negative for cold intolerance and heat intolerance.  Genitourinary:  Negative for dysuria, frequency, urgency, vaginal bleeding and vaginal discharge.  Musculoskeletal:  Positive for arthralgias and myalgias.  Skin:  Negative for rash and wound.  Neurological:  Negative for dizziness, light-headedness and headaches.  Hematological:  Does not bruise/bleed easily.  Psychiatric/Behavioral:  Negative for dysphoric mood, self-injury, sleep disturbance and suicidal ideas. The patient is not nervous/anxious.    Objective:   Today's Vitals: BP 130/82 (BP Location: Left Arm, Patient Position: Sitting, Cuff Size: Normal)   Pulse 76   Resp 20   Ht 5\' 4"  (1.626 m)   Wt 139 lb 1.6 oz (63.1 kg)   SpO2 98%   BMI 23.88 kg/m   Physical Exam Vitals reviewed.  Constitutional:      General: She is not in acute distress.    Appearance: Normal appearance. She is normal weight. She is not ill-appearing.  HENT:     Head: Normocephalic and atraumatic.     Right Ear: Tympanic membrane, ear canal and external ear normal. There is no impacted cerumen.     Left  Ear: Tympanic membrane, ear canal and external ear normal. There is no impacted cerumen.  Eyes:     General:        Right eye: No discharge.        Left eye: No discharge.     Extraocular Movements: Extraocular movements intact.     Conjunctiva/sclera: Conjunctivae normal.     Pupils: Pupils are equal, round, and reactive to light.  Cardiovascular:     Rate and Rhythm: Normal rate and regular rhythm.     Pulses: Normal pulses.     Heart sounds: Normal heart sounds. No murmur heard.   No friction rub. No gallop.   Pulmonary:     Effort: Pulmonary effort is normal. No respiratory distress.     Breath sounds: Normal breath sounds. No wheezing.  Abdominal:     General: Bowel sounds are normal. There is no distension.     Palpations: Abdomen is soft. There is no mass.     Tenderness: There is no abdominal tenderness. There is no guarding or rebound.     Hernia: No hernia is present.  Musculoskeletal:        General: Normal range of motion.  Skin:    General: Skin is warm and dry.  Neurological:     Mental Status: She is alert and oriented to person, place, and time.  Psychiatric:        Mood and Affect: Mood normal.        Behavior: Behavior normal.        Thought Content: Thought content normal.        Judgment: Judgment normal.    Assessment & Plan:   1. Encounter to establish care Reviewed available information and discussed care concerns with patient.   2. Age-related osteoporosis without current pathological fracture DEXA scan ordered today.  - DG Bone Density; Future  3. Encounter for screening mammogram for malignant neoplasm of breast Mammogram ordered.  - MM 3D SCREEN BREAST BILATERAL; Future  4. Annual physical exam Checking CBC w/diff,  - CBC with Differential/Platelet - COMPLETE METABOLIC PANEL WITH GFR - Lipid panel  Outpatient Encounter Medications as of 02/20/2021  Medication Sig   [DISCONTINUED] alendronate (FOSAMAX) 70 MG tablet Take by mouth.   [DISCONTINUED] triamcinolone cream (KENALOG) 0.1 % Apply to corners of lips   [DISCONTINUED] ascorbic acid (VITAMIN C) 1000 MG tablet Take by mouth.   [DISCONTINUED] B Complex Vitamins (VITAMIN B-COMPLEX) TABS Take by mouth.   [DISCONTINUED] Glucosamine Sulfate 500 MG TABS Take by mouth.   [DISCONTINUED] ibuprofen (ADVIL) 400 MG tablet Take 1-2 tablets (400-800 mg total) by mouth every 8 (eight) hours as needed.   [DISCONTINUED] Multiple Vitamin (MULTIVITAMIN) capsule Take by mouth.   [DISCONTINUED] nitroGLYCERIN (NITRODUR  - DOSED IN MG/24 HR) 0.2 mg/hr patch Cut and apply 1/2 patch to most painful area q24h.   [DISCONTINUED] Omega-3 1000 MG CAPS Take by mouth.   No facility-administered encounter medications on file as of 02/20/2021.    Follow-up: Return in about 1 year (around 02/20/2022) for annual physical exam.   Thayer Ohm, DNP, APRN, FNP-BC [facility] and [facility] Medicine

## 2021-03-13 ENCOUNTER — Other Ambulatory Visit: Payer: Self-pay

## 2021-03-13 ENCOUNTER — Ambulatory Visit (INDEPENDENT_AMBULATORY_CARE_PROVIDER_SITE_OTHER): Payer: 59

## 2021-03-13 DIAGNOSIS — M810 Age-related osteoporosis without current pathological fracture: Secondary | ICD-10-CM

## 2021-03-13 DIAGNOSIS — Z1231 Encounter for screening mammogram for malignant neoplasm of breast: Secondary | ICD-10-CM | POA: Diagnosis not present

## 2021-03-14 ENCOUNTER — Encounter: Payer: Self-pay | Admitting: Medical-Surgical

## 2021-03-18 ENCOUNTER — Encounter: Payer: Self-pay | Admitting: Medical-Surgical

## 2021-03-18 MED ORDER — ALENDRONATE SODIUM 70 MG PO TABS: 70.0000 mg | ORAL_TABLET | ORAL | 3 refills | Status: AC

## 2021-03-20 ENCOUNTER — Ambulatory Visit: Payer: 59 | Admitting: Sports Medicine

## 2021-03-21 ENCOUNTER — Ambulatory Visit: Payer: 59 | Admitting: Sports Medicine

## 2021-04-15 ENCOUNTER — Other Ambulatory Visit: Payer: Self-pay

## 2021-04-15 ENCOUNTER — Ambulatory Visit (INDEPENDENT_AMBULATORY_CARE_PROVIDER_SITE_OTHER): Payer: 59 | Admitting: Sports Medicine

## 2021-04-15 DIAGNOSIS — M2550 Pain in unspecified joint: Secondary | ICD-10-CM | POA: Insufficient documentation

## 2021-04-15 NOTE — Assessment & Plan Note (Signed)
This is a pleasant 70 year old female, she has multiple orthopedic and medical problems including osteoporosis, rotator cuff dysfunction, knee osteoarthritis with meniscal tearing, insertional Achilles tendinosis, she works at the [employer], she is looking into retirement, I am going to go ahead and write her a note out of work for the next 2 months to keep her fully out until her actual retirement date. She will also think about seeing another sports medicine doctor (Dr. Joseph Berkshire) closer to her as she is moving to [facility].

## 2021-04-15 NOTE — Progress Notes (Signed)
    Procedures performed today:    None.  Independent interpretation of notes and tests performed by another provider:   None.  Brief History, Exam, Impression, and Recommendations:    Polyarthralgia This is a pleasant 70 year old female, she has multiple orthopedic and medical problems including osteoporosis, rotator cuff dysfunction, knee osteoarthritis with meniscal tearing, insertional Achilles tendinosis, she works at the [employer] office, she is looking into retirement, I am going to go ahead and write her a note out of work for the next 2 months to keep her fully out until her actual retirement date. She will also think about seeing another sports medicine doctor (Dr. Joseph Berkshire) closer to her as she is moving to [facility].    ___________________________________________ Ihor Austin. Benjamin Stain, M.D., ABFM., CAQSM. Primary Care and Sports Medicine [facility]  Adjunct Instructor of Family Medicine  [employer]

## 2021-06-10 ENCOUNTER — Encounter: Payer: Self-pay | Admitting: Sports Medicine

## 2021-06-14 ENCOUNTER — Ambulatory Visit: Payer: 59 | Admitting: Sports Medicine

## 2021-06-15 ENCOUNTER — Encounter: Payer: Self-pay | Admitting: Medical-Surgical

## 2021-10-16 DIAGNOSIS — H01004 Unspecified blepharitis left upper eyelid: Secondary | ICD-10-CM | POA: Diagnosis not present

## 2021-10-16 DIAGNOSIS — H01002 Unspecified blepharitis right lower eyelid: Secondary | ICD-10-CM | POA: Diagnosis not present

## 2021-10-16 DIAGNOSIS — H01001 Unspecified blepharitis right upper eyelid: Secondary | ICD-10-CM | POA: Diagnosis not present

## 2021-10-16 DIAGNOSIS — H16223 Keratoconjunctivitis sicca, not specified as Sjogren's, bilateral: Secondary | ICD-10-CM | POA: Diagnosis not present

## 2021-10-16 DIAGNOSIS — H524 Presbyopia: Secondary | ICD-10-CM | POA: Diagnosis not present

## 2021-10-16 DIAGNOSIS — H5203 Hypermetropia, bilateral: Secondary | ICD-10-CM | POA: Diagnosis not present

## 2022-02-18 DIAGNOSIS — M7662 Achilles tendinitis, left leg: Secondary | ICD-10-CM | POA: Diagnosis not present

## 2022-02-18 DIAGNOSIS — M722 Plantar fascial fibromatosis: Secondary | ICD-10-CM | POA: Diagnosis not present

## 2022-02-19 DIAGNOSIS — M79672 Pain in left foot: Secondary | ICD-10-CM | POA: Diagnosis not present

## 2022-02-19 DIAGNOSIS — M25675 Stiffness of left foot, not elsewhere classified: Secondary | ICD-10-CM | POA: Diagnosis not present

## 2022-02-19 DIAGNOSIS — M7662 Achilles tendinitis, left leg: Secondary | ICD-10-CM | POA: Diagnosis not present

## 2022-02-19 DIAGNOSIS — M79671 Pain in right foot: Secondary | ICD-10-CM | POA: Diagnosis not present

## 2022-02-19 DIAGNOSIS — M722 Plantar fascial fibromatosis: Secondary | ICD-10-CM | POA: Diagnosis not present

## 2022-02-19 DIAGNOSIS — M25674 Stiffness of right foot, not elsewhere classified: Secondary | ICD-10-CM | POA: Diagnosis not present

## 2022-02-24 DIAGNOSIS — M79671 Pain in right foot: Secondary | ICD-10-CM | POA: Diagnosis not present

## 2022-02-24 DIAGNOSIS — M7662 Achilles tendinitis, left leg: Secondary | ICD-10-CM | POA: Diagnosis not present

## 2022-02-24 DIAGNOSIS — M79672 Pain in left foot: Secondary | ICD-10-CM | POA: Diagnosis not present

## 2022-02-24 DIAGNOSIS — M722 Plantar fascial fibromatosis: Secondary | ICD-10-CM | POA: Diagnosis not present

## 2022-02-24 DIAGNOSIS — M25675 Stiffness of left foot, not elsewhere classified: Secondary | ICD-10-CM | POA: Diagnosis not present

## 2022-02-24 DIAGNOSIS — M25674 Stiffness of right foot, not elsewhere classified: Secondary | ICD-10-CM | POA: Diagnosis not present

## 2022-02-26 DIAGNOSIS — Z0001 Encounter for general adult medical examination with abnormal findings: Secondary | ICD-10-CM | POA: Diagnosis not present

## 2022-02-26 DIAGNOSIS — Z0000 Encounter for general adult medical examination without abnormal findings: Secondary | ICD-10-CM | POA: Diagnosis not present

## 2022-02-26 DIAGNOSIS — M810 Age-related osteoporosis without current pathological fracture: Secondary | ICD-10-CM | POA: Diagnosis not present

## 2022-03-11 DIAGNOSIS — Z1231 Encounter for screening mammogram for malignant neoplasm of breast: Secondary | ICD-10-CM | POA: Diagnosis not present

## 2022-03-12 ENCOUNTER — Other Ambulatory Visit: Payer: Self-pay

## 2022-03-12 NOTE — Progress Notes (Signed)
Patient needs appointment for further refills.  Last office visit 02/20/2021  Last filled 03/18/2021  Refill Refused

## 2022-03-12 NOTE — Progress Notes (Signed)
Patient stated that she never requested a med refill, and that she has a new pcp. She doesn't live in the area any longer- tvt

## 2022-04-08 DIAGNOSIS — Z23 Encounter for immunization: Secondary | ICD-10-CM | POA: Diagnosis not present

## 2022-06-28 DIAGNOSIS — R059 Cough, unspecified: Secondary | ICD-10-CM | POA: Diagnosis not present

## 2022-06-28 DIAGNOSIS — J069 Acute upper respiratory infection, unspecified: Secondary | ICD-10-CM | POA: Diagnosis not present

## 2022-07-07 DIAGNOSIS — N3090 Cystitis, unspecified without hematuria: Secondary | ICD-10-CM | POA: Diagnosis not present

## 2022-07-07 DIAGNOSIS — R300 Dysuria: Secondary | ICD-10-CM | POA: Diagnosis not present

## 2024-03-01 ENCOUNTER — Encounter: Payer: Self-pay | Admitting: Sports Medicine
